# Patient Record
Sex: Female | Born: 1990 | Race: White | Hispanic: No | Marital: Married | Smoking: Never smoker
Health system: Southern US, Community
[De-identification: ages and names within clinical notes are randomized; demographics above are authoritative.]

## PROBLEM LIST (undated history)

## (undated) DIAGNOSIS — Z789 Other specified health status: Secondary | ICD-10-CM

## (undated) DIAGNOSIS — D649 Anemia, unspecified: Secondary | ICD-10-CM

## (undated) HISTORY — PX: CHOLECYSTECTOMY: SHX55

---
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
## 2013-08-29 ENCOUNTER — Emergency Department: Payer: Self-pay | Admitting: Emergency Medicine

## 2013-09-02 ENCOUNTER — Encounter (HOSPITAL_COMMUNITY): Payer: Self-pay | Admitting: Pharmacy Technician

## 2013-09-02 ENCOUNTER — Encounter (HOSPITAL_COMMUNITY): Payer: Self-pay | Admitting: *Deleted

## 2013-09-03 ENCOUNTER — Ambulatory Visit (HOSPITAL_COMMUNITY)
Admission: RE | Admit: 2013-09-03 | Payer: Managed Care, Other (non HMO) | Source: Ambulatory Visit | Admitting: Orthopedic Surgery

## 2013-09-03 HISTORY — DX: Other specified health status: Z78.9

## 2013-09-03 SURGERY — OPEN REDUCTION INTERNAL FIXATION (ORIF) HUMERAL SHAFT FRACTURE
Anesthesia: General | Laterality: Right

## 2013-09-04 ENCOUNTER — Other Ambulatory Visit (HOSPITAL_COMMUNITY): Payer: Self-pay | Admitting: Orthopedic Surgery

## 2013-09-05 ENCOUNTER — Ambulatory Visit (HOSPITAL_COMMUNITY): Payer: Managed Care, Other (non HMO)

## 2013-09-05 ENCOUNTER — Encounter (HOSPITAL_COMMUNITY): Payer: Managed Care, Other (non HMO) | Admitting: Certified Registered"

## 2013-09-05 ENCOUNTER — Observation Stay (HOSPITAL_COMMUNITY)
Admission: RE | Admit: 2013-09-05 | Discharge: 2013-09-06 | Disposition: A | Discharge status: Home / Self Care | Payer: Managed Care, Other (non HMO) | Source: Ambulatory Visit | Attending: Orthopedic Surgery | Admitting: Orthopedic Surgery

## 2013-09-05 ENCOUNTER — Encounter (HOSPITAL_COMMUNITY): Payer: Self-pay | Admitting: *Deleted

## 2013-09-05 ENCOUNTER — Ambulatory Visit (HOSPITAL_COMMUNITY): Payer: Managed Care, Other (non HMO) | Admitting: Certified Registered"

## 2013-09-05 ENCOUNTER — Encounter (HOSPITAL_COMMUNITY)
Admission: RE | Disposition: A | Discharge status: Home / Self Care | Payer: Self-pay | Source: Ambulatory Visit | Attending: Orthopedic Surgery

## 2013-09-05 ENCOUNTER — Observation Stay (HOSPITAL_COMMUNITY): Payer: Managed Care, Other (non HMO)

## 2013-09-05 DIAGNOSIS — G5630 Lesion of radial nerve, unspecified upper limb: Secondary | ICD-10-CM | POA: Insufficient documentation

## 2013-09-05 DIAGNOSIS — S42309A Unspecified fracture of shaft of humerus, unspecified arm, initial encounter for closed fracture: Principal | ICD-10-CM | POA: Insufficient documentation

## 2013-09-05 DIAGNOSIS — W19XXXA Unspecified fall, initial encounter: Secondary | ICD-10-CM | POA: Insufficient documentation

## 2013-09-05 HISTORY — PX: ORIF HUMERUS FRACTURE: SHX2126

## 2013-09-05 LAB — CBC
HEMATOCRIT: 41.8 % (ref 36.0–46.0)
HEMOGLOBIN: 14.3 g/dL (ref 12.0–15.0)
MCH: 29.9 pg (ref 26.0–34.0)
MCHC: 34.2 g/dL (ref 30.0–36.0)
MCV: 87.3 fL (ref 78.0–100.0)
Platelets: 264 10*3/uL (ref 150–400)
RBC: 4.79 MIL/uL (ref 3.87–5.11)
RDW: 12.7 % (ref 11.5–15.5)
WBC: 10.1 10*3/uL (ref 4.0–10.5)

## 2013-09-05 LAB — HCG, SERUM, QUALITATIVE: Preg, Serum: NEGATIVE

## 2013-09-05 SURGERY — OPEN REDUCTION INTERNAL FIXATION (ORIF) HUMERAL SHAFT FRACTURE
Anesthesia: Regional | Laterality: Right

## 2013-09-05 MED ORDER — LIDOCAINE HCL (CARDIAC) 20 MG/ML IV SOLN
INTRAVENOUS | Status: AC
  Filled 2013-09-05: qty 5

## 2013-09-05 MED ORDER — ACETAMINOPHEN 325 MG PO TABS: 650.0000 mg | ORAL_TABLET | Freq: Four times a day (QID) | ORAL | Status: DC | PRN

## 2013-09-05 MED ORDER — FENTANYL CITRATE 0.05 MG/ML IJ SOLN
INTRAMUSCULAR | Status: AC
  Administered 2013-09-05: 50 ug via INTRAVENOUS
  Filled 2013-09-05: qty 2

## 2013-09-05 MED ORDER — ACETAMINOPHEN 325 MG PO TABS: 325.0000 mg | ORAL_TABLET | ORAL | Status: DC | PRN

## 2013-09-05 MED ORDER — METHOCARBAMOL 500 MG PO TABS: 500.0000 mg | ORAL_TABLET | Freq: Four times a day (QID) | ORAL | Status: DC | PRN

## 2013-09-05 MED ORDER — ARTIFICIAL TEARS OP OINT
TOPICAL_OINTMENT | OPHTHALMIC | Status: DC | PRN
  Administered 2013-09-05: 1 via OPHTHALMIC

## 2013-09-05 MED ORDER — OXYCODONE HCL 5 MG/5ML PO SOLN: 5.0000 mg | Freq: Once | ORAL | Status: AC | PRN

## 2013-09-05 MED ORDER — DOCUSATE SODIUM 100 MG PO CAPS
100.0000 mg | ORAL_CAPSULE | Freq: Two times a day (BID) | ORAL | Status: DC
  Administered 2013-09-05: 100 mg via ORAL
  Filled 2013-09-05 (×3): qty 1

## 2013-09-05 MED ORDER — ACETAMINOPHEN 650 MG RE SUPP: 650.0000 mg | Freq: Four times a day (QID) | RECTAL | Status: DC | PRN

## 2013-09-05 MED ORDER — SUCCINYLCHOLINE CHLORIDE 20 MG/ML IJ SOLN
INTRAMUSCULAR | Status: AC
  Filled 2013-09-05: qty 1

## 2013-09-05 MED ORDER — MIDAZOLAM HCL 2 MG/2ML IJ SOLN
1.0000 mg | INTRAMUSCULAR | Status: DC | PRN
  Administered 2013-09-05: 2 mg via INTRAVENOUS

## 2013-09-05 MED ORDER — DEXTROSE 5 % IV SOLN: 500.0000 mg | Freq: Four times a day (QID) | INTRAVENOUS | Status: DC | PRN

## 2013-09-05 MED ORDER — ACETAMINOPHEN 160 MG/5ML PO SOLN: 325.0000 mg | ORAL | Status: DC | PRN

## 2013-09-05 MED ORDER — KETOROLAC TROMETHAMINE 30 MG/ML IJ SOLN
30.0000 mg | Freq: Four times a day (QID) | INTRAMUSCULAR | Status: DC
  Administered 2013-09-05 – 2013-09-06 (×2): 30 mg via INTRAVENOUS
  Filled 2013-09-05 (×6): qty 1

## 2013-09-05 MED ORDER — PROPOFOL 10 MG/ML IV BOLUS
INTRAVENOUS | Status: AC
  Filled 2013-09-05: qty 20

## 2013-09-05 MED ORDER — ONDANSETRON HCL 4 MG/2ML IJ SOLN: 4.0000 mg | Freq: Four times a day (QID) | INTRAMUSCULAR | Status: DC | PRN

## 2013-09-05 MED ORDER — ARTIFICIAL TEARS OP OINT
TOPICAL_OINTMENT | OPHTHALMIC | Status: AC
  Filled 2013-09-05: qty 3.5

## 2013-09-05 MED ORDER — EPHEDRINE SULFATE 50 MG/ML IJ SOLN
INTRAMUSCULAR | Status: AC
  Filled 2013-09-05: qty 1

## 2013-09-05 MED ORDER — MENTHOL 3 MG MT LOZG: 1.0000 | LOZENGE | OROMUCOSAL | Status: DC | PRN

## 2013-09-05 MED ORDER — FENTANYL CITRATE 0.05 MG/ML IJ SOLN
50.0000 ug | INTRAMUSCULAR | Status: DC | PRN
  Administered 2013-09-05: 50 ug via INTRAVENOUS

## 2013-09-05 MED ORDER — NEOSTIGMINE METHYLSULFATE 1 MG/ML IJ SOLN
INTRAMUSCULAR | Status: AC
  Filled 2013-09-05: qty 10

## 2013-09-05 MED ORDER — METOCLOPRAMIDE HCL 10 MG PO TABS: 5.0000 mg | ORAL_TABLET | Freq: Three times a day (TID) | ORAL | Status: DC | PRN

## 2013-09-05 MED ORDER — POTASSIUM CHLORIDE IN NACL 20-0.9 MEQ/L-% IV SOLN
INTRAVENOUS | Status: DC
  Administered 2013-09-05 – 2013-09-06 (×2): via INTRAVENOUS
  Filled 2013-09-05 (×2): qty 1000

## 2013-09-05 MED ORDER — CEFAZOLIN SODIUM-DEXTROSE 2-3 GM-% IV SOLR
INTRAVENOUS | Status: DC | PRN
  Administered 2013-09-05: 2 g via INTRAVENOUS

## 2013-09-05 MED ORDER — ONDANSETRON HCL 4 MG/2ML IJ SOLN
INTRAMUSCULAR | Status: DC | PRN
  Administered 2013-09-05: 4 mg via INTRAVENOUS

## 2013-09-05 MED ORDER — ROPIVACAINE HCL 5 MG/ML IJ SOLN
INTRAMUSCULAR | Status: DC | PRN
  Administered 2013-09-05: 20 mL via PERINEURAL

## 2013-09-05 MED ORDER — FENTANYL CITRATE 0.05 MG/ML IJ SOLN
25.0000 ug | INTRAMUSCULAR | Status: DC | PRN
  Administered 2013-09-05: 25 ug via INTRAVENOUS
  Administered 2013-09-05: 50 ug via INTRAVENOUS

## 2013-09-05 MED ORDER — SODIUM CHLORIDE 0.9 % IJ SOLN
INTRAMUSCULAR | Status: AC
  Filled 2013-09-05: qty 10

## 2013-09-05 MED ORDER — NEOSTIGMINE METHYLSULFATE 1 MG/ML IJ SOLN
INTRAMUSCULAR | Status: DC | PRN
  Administered 2013-09-05: 2 mg via INTRAVENOUS

## 2013-09-05 MED ORDER — ROCURONIUM BROMIDE 50 MG/5ML IV SOLN
INTRAVENOUS | Status: AC
  Filled 2013-09-05: qty 1

## 2013-09-05 MED ORDER — OXYCODONE HCL 5 MG PO TABS
5.0000 mg | ORAL_TABLET | ORAL | Status: DC | PRN
  Administered 2013-09-05 – 2013-09-06 (×4): 10 mg via ORAL
  Filled 2013-09-05 (×4): qty 2

## 2013-09-05 MED ORDER — PHENOL 1.4 % MT LIQD: 1.0000 | OROMUCOSAL | Status: DC | PRN

## 2013-09-05 MED ORDER — CEFAZOLIN SODIUM-DEXTROSE 2-3 GM-% IV SOLR
2.0000 g | Freq: Three times a day (TID) | INTRAVENOUS | Status: AC
  Administered 2013-09-05 – 2013-09-06 (×2): 2 g via INTRAVENOUS
  Filled 2013-09-05 (×2): qty 50

## 2013-09-05 MED ORDER — LACTATED RINGERS IV SOLN
INTRAVENOUS | Status: DC
  Administered 2013-09-05 (×2): via INTRAVENOUS

## 2013-09-05 MED ORDER — GLYCOPYRROLATE 0.2 MG/ML IJ SOLN
INTRAMUSCULAR | Status: DC | PRN
  Administered 2013-09-05: 0.3 mg via INTRAVENOUS

## 2013-09-05 MED ORDER — FENTANYL CITRATE 0.05 MG/ML IJ SOLN
INTRAMUSCULAR | Status: AC
  Filled 2013-09-05: qty 2

## 2013-09-05 MED ORDER — ROCURONIUM BROMIDE 100 MG/10ML IV SOLN
INTRAVENOUS | Status: DC | PRN
  Administered 2013-09-05: 10 mg via INTRAVENOUS
  Administered 2013-09-05: 50 mg via INTRAVENOUS

## 2013-09-05 MED ORDER — MIDAZOLAM HCL 2 MG/2ML IJ SOLN
INTRAMUSCULAR | Status: AC
  Administered 2013-09-05: 2 mg via INTRAVENOUS
  Filled 2013-09-05: qty 2

## 2013-09-05 MED ORDER — OXYCODONE HCL 5 MG PO TABS
ORAL_TABLET | ORAL | Status: AC
  Filled 2013-09-05: qty 1

## 2013-09-05 MED ORDER — MORPHINE SULFATE 2 MG/ML IJ SOLN
1.0000 mg | INTRAMUSCULAR | Status: DC | PRN
  Administered 2013-09-05 – 2013-09-06 (×2): 1 mg via INTRAVENOUS
  Filled 2013-09-05 (×2): qty 1

## 2013-09-05 MED ORDER — CEFAZOLIN SODIUM-DEXTROSE 2-3 GM-% IV SOLR
INTRAVENOUS | Status: AC
  Filled 2013-09-05: qty 50

## 2013-09-05 MED ORDER — GLYCOPYRROLATE 0.2 MG/ML IJ SOLN
INTRAMUSCULAR | Status: AC
  Filled 2013-09-05: qty 3

## 2013-09-05 MED ORDER — SODIUM CHLORIDE 0.9 % IR SOLN
Status: DC | PRN
  Administered 2013-09-05: 1000 mL

## 2013-09-05 MED ORDER — ONDANSETRON HCL 4 MG PO TABS: 4.0000 mg | ORAL_TABLET | Freq: Four times a day (QID) | ORAL | Status: DC | PRN

## 2013-09-05 MED ORDER — PROPOFOL 10 MG/ML IV BOLUS
INTRAVENOUS | Status: DC | PRN
  Administered 2013-09-05: 50 mg via INTRAVENOUS
  Administered 2013-09-05: 120 mg via INTRAVENOUS

## 2013-09-05 MED ORDER — ONDANSETRON HCL 4 MG/2ML IJ SOLN: 4.0000 mg | Freq: Once | INTRAMUSCULAR | Status: DC | PRN

## 2013-09-05 MED ORDER — ONDANSETRON HCL 4 MG/2ML IJ SOLN
INTRAMUSCULAR | Status: AC
  Filled 2013-09-05: qty 2

## 2013-09-05 MED ORDER — LIDOCAINE HCL (CARDIAC) 20 MG/ML IV SOLN
INTRAVENOUS | Status: DC | PRN
  Administered 2013-09-05: 100 mg via INTRAVENOUS

## 2013-09-05 MED ORDER — METOCLOPRAMIDE HCL 5 MG/ML IJ SOLN: 5.0000 mg | Freq: Three times a day (TID) | INTRAMUSCULAR | Status: DC | PRN

## 2013-09-05 MED ORDER — OXYCODONE HCL 5 MG PO TABS
5.0000 mg | ORAL_TABLET | Freq: Once | ORAL | Status: AC | PRN
  Administered 2013-09-05: 5 mg via ORAL

## 2013-09-05 SURGICAL SUPPLY — 51 items
BANDAGE ELASTIC 4 VELCRO ST LF (GAUZE/BANDAGES/DRESSINGS) ×3 IMPLANT
BANDAGE ELASTIC 6 VELCRO ST LF (GAUZE/BANDAGES/DRESSINGS) ×3 IMPLANT
BENZOIN TINCTURE PRP APPL 2/3 (GAUZE/BANDAGES/DRESSINGS) ×3 IMPLANT
BIT DRILL 3.2 QC DISP (BIT) ×3 IMPLANT
BNDG COHESIVE 4X5 TAN STRL (GAUZE/BANDAGES/DRESSINGS) ×3 IMPLANT
CLOSURE WOUND 1/2 X4 (GAUZE/BANDAGES/DRESSINGS) ×1
COVER SURGICAL LIGHT HANDLE (MISCELLANEOUS) ×3 IMPLANT
DRAIN PENROSE 1/2X12 LTX STRL (WOUND CARE) IMPLANT
DRAPE C-ARM 42X72 X-RAY (DRAPES) IMPLANT
DRAPE INCISE IOBAN 85X60 (DRAPES) ×3 IMPLANT
DRAPE U-SHAPE 47X51 STRL (DRAPES) ×3 IMPLANT
DRSG MEPILEX BORDER 4X12 (GAUZE/BANDAGES/DRESSINGS) ×3 IMPLANT
DRSG PAD ABDOMINAL 8X10 ST (GAUZE/BANDAGES/DRESSINGS) IMPLANT
DURAPREP 26ML APPLICATOR (WOUND CARE) ×3 IMPLANT
ELECT REM PT RETURN 9FT ADLT (ELECTROSURGICAL) ×3
ELECTRODE REM PT RTRN 9FT ADLT (ELECTROSURGICAL) ×1 IMPLANT
FACESHIELD LNG OPTICON STERILE (SAFETY) ×3 IMPLANT
GAUZE XEROFORM 5X9 LF (GAUZE/BANDAGES/DRESSINGS) IMPLANT
GLOVE BIOGEL PI IND STRL 8 (GLOVE) ×1 IMPLANT
GLOVE BIOGEL PI INDICATOR 8 (GLOVE) ×2
GLOVE SURG ORTHO 8.0 STRL STRW (GLOVE) ×3 IMPLANT
GOWN PREVENTION PLUS LG XLONG (DISPOSABLE) IMPLANT
GOWN PREVENTION PLUS XLARGE (GOWN DISPOSABLE) ×3 IMPLANT
GOWN STRL NON-REIN LRG LVL3 (GOWN DISPOSABLE) ×6 IMPLANT
KIT BASIN OR (CUSTOM PROCEDURE TRAY) ×3 IMPLANT
KIT ROOM TURNOVER OR (KITS) ×3 IMPLANT
MANIFOLD NEPTUNE II (INSTRUMENTS) IMPLANT
NEEDLE 21X1 OR PACK (NEEDLE) IMPLANT
NS IRRIG 1000ML POUR BTL (IV SOLUTION) ×3 IMPLANT
PACK SHOULDER (CUSTOM PROCEDURE TRAY) ×3 IMPLANT
PAD ARMBOARD 7.5X6 YLW CONV (MISCELLANEOUS) ×6 IMPLANT
PAD CAST 4YDX4 CTTN HI CHSV (CAST SUPPLIES) IMPLANT
PADDING CAST COTTON 4X4 STRL (CAST SUPPLIES)
PENCIL BUTTON HOLSTER BLD 10FT (ELECTRODE) IMPLANT
PLATE LOCK COMP 8H 4.5 (Plate) ×3 IMPLANT
SCREW NLOCK CORT 4.5X20 (Screw) ×6 IMPLANT
SCREW NLOCK CORT 4.5X22 (Screw) ×3 IMPLANT
SCREW NLOCK CORT 4.5X24 (Screw) ×15 IMPLANT
SPONGE GAUZE 4X4 12PLY (GAUZE/BANDAGES/DRESSINGS) IMPLANT
SPONGE LAP 4X18 X RAY DECT (DISPOSABLE) ×6 IMPLANT
STAPLER VISISTAT 35W (STAPLE) IMPLANT
STOCKINETTE IMPERVIOUS 9X36 MD (GAUZE/BANDAGES/DRESSINGS) IMPLANT
STRIP CLOSURE SKIN 1/2X4 (GAUZE/BANDAGES/DRESSINGS) ×2 IMPLANT
SUCTION FRAZIER TIP 10 FR DISP (SUCTIONS) IMPLANT
SUT VIC AB 2-0 CTB1 (SUTURE) IMPLANT
TOWEL OR 17X24 6PK STRL BLUE (TOWEL DISPOSABLE) ×3 IMPLANT
TOWEL OR 17X26 10 PK STRL BLUE (TOWEL DISPOSABLE) ×3 IMPLANT
TUBE CONNECTING 12'X1/4 (SUCTIONS)
TUBE CONNECTING 12X1/4 (SUCTIONS) IMPLANT
WATER STERILE IRR 1000ML POUR (IV SOLUTION) IMPLANT
YANKAUER SUCT BULB TIP NO VENT (SUCTIONS) IMPLANT

## 2013-09-05 NOTE — Anesthesia Procedure Notes (Addendum)
Anesthesia Regional Block:  Supraclavicular block  Pre-Anesthetic Checklist: ,, timeout performed, Correct Patient, Correct Site, Correct Laterality, Correct Procedure, Correct Position, site marked, Risks and benefits discussed,  Surgical consent,  Pre-op evaluation,  At surgeon's request and post-op pain management  Laterality: Upper and Right  Prep: chloraprep       Needles:  Injection technique: Single-shot  Needle Type: Echogenic Stimulator Needle          Additional Needles:  Procedures: ultrasound guided (picture in chart) Supraclavicular block Narrative:  Start time: 09/05/2013 3:29 PM End time: 09/05/2013 3:34 PM Injection made incrementally with aspirations every 5 mL.  Performed by: Personally  Anesthesiologist: Maple HudsonMoser  Additional Notes: H+P and labs reviewed, risks and benefits discussed with patient, procedure tolerated well without complications   Procedure Name: Intubation Date/Time: 09/05/2013 5:00 PM Performed by: Jefm MilesENNIE, Shaquil Aldana E Pre-anesthesia Checklist: Patient identified, Emergency Drugs available, Suction available, Patient being monitored and Timeout performed Patient Re-evaluated:Patient Re-evaluated prior to inductionOxygen Delivery Method: Circle system utilized Preoxygenation: Pre-oxygenation with 100% oxygen Intubation Type: IV induction Ventilation: Mask ventilation without difficulty Laryngoscope Size: Mac and 3 Grade View: Grade I Tube type: Oral Tube size: 7.0 mm Number of attempts: 1 Airway Equipment and Method: Stylet Placement Confirmation: ETT inserted through vocal cords under direct vision,  positive ETCO2 and breath sounds checked- equal and bilateral Secured at: 21 cm Tube secured with: Tape Dental Injury: Teeth and Oropharynx as per pre-operative assessment

## 2013-09-05 NOTE — Transfer of Care (Signed)
Immediate Anesthesia Transfer of Care Note  Patient: Nancy Huffman  Procedure(s) Performed: Procedure(s) with comments: OPEN REDUCTION INTERNAL FIXATION (ORIF) HUMERAL SHAFT FRACTURE (Right) - Right Humeral Shaft Fracture Fixation  Patient Location: PACU  Anesthesia Type:GA combined with regional for post-op pain  Level of Consciousness: awake, alert  and oriented  Airway & Oxygen Therapy: Patient Spontanous Breathing and Patient connected to nasal cannula oxygen  Post-op Assessment: Report given to PACU RN and Post -op Vital signs reviewed and stable  Post vital signs: Reviewed and stable  Complications: No apparent anesthesia complications

## 2013-09-05 NOTE — H&P (Signed)
Nancy FarberBrittany Huffman is an 23 y.o. female.   Chief Complaint: Right arm pain HPI: [place] is a 23 year old female right-hand-dominant who fell on her right arm 08/28/2013. She sustained a closed fracture of the humeral shaft transverse. She did develop a radial nerve palsy from this fracture. She initially saw Dr. handy but because of administrative issues she is referred to me for further management. She reports continued instability in the arm. He recommended fixation of the fracture. She's evaluated in clinic yesterday and I do agree with that assessment. There is no family history of DVT or pulmonary embolism.  Past Medical History  Diagnosis Date  . Medical history non-contributory     Past Surgical History  Procedure Laterality Date  . Cholecystectomy      History reviewed. No pertinent family history. Social History:  reports that she has never smoked. She does not have any smokeless tobacco history on file. She reports that she drinks alcohol. She reports that she does not use illicit drugs.  Allergies: No Known Allergies  Medications Prior to Admission  Medication Sig Dispense Refill  . oxyCODONE-acetaminophen (PERCOCET/ROXICET) 5-325 MG per tablet Take 1 tablet by mouth every 4 (four) hours as needed for severe pain.        Results for orders placed during the hospital encounter of 09/05/13 (from the past 48 hour(s))  HCG, SERUM, QUALITATIVE     Status: None   Collection Time    09/05/13  2:04 PM      Result Value Ref Range   Preg, Serum NEGATIVE  NEGATIVE   Comment: SHORT SAMPLE                THE SENSITIVITY OF THIS     METHODOLOGY IS >10 mIU/mL.  CBC     Status: None   Collection Time    09/05/13  2:04 PM      Result Value Ref Range   WBC 10.1  4.0 - 10.5 K/uL   RBC 4.79  3.87 - 5.11 MIL/uL   Hemoglobin 14.3  12.0 - 15.0 g/dL   HCT 40.941.8  81.136.0 - 91.446.0 %   MCV 87.3  78.0 - 100.0 fL   MCH 29.9  26.0 - 34.0 pg   MCHC 34.2  30.0 - 36.0 g/dL   RDW 78.212.7  95.611.5 - 21.315.5 %   Platelets 264  150 - 400 K/uL   No results found.  Review of Systems  Constitutional: Negative.   HENT: Negative.   Eyes: Negative.   Respiratory: Negative.   Cardiovascular: Negative.   Gastrointestinal: Negative.   Genitourinary: Negative.   Musculoskeletal: Negative.   Skin: Negative.   Neurological: Negative.   Endo/Heme/Allergies: Negative.   Psychiatric/Behavioral: Negative.     Blood pressure 119/53, pulse 84, temperature 98.3 F (36.8 C), temperature source Oral, resp. rate 14, height 5\' 2"  (1.575 m), weight 79.578 kg (175 lb 7 oz), last menstrual period 08/15/2013, SpO2 100.00%. Physical Exam  Constitutional: She appears well-developed.  HENT:  Head: Normocephalic.  Eyes: Pupils are equal, round, and reactive to light.  Neck: Normal range of motion.  Cardiovascular: Normal rate.   Respiratory: Effort normal.  Neurological: She is alert.  Skin: Skin is warm.  Psychiatric: She has a normal mood and affect.   examination the right arm demonstrates unstable fracture rate or pulse intact radial nerve nonfunctional paresthesias present on the dorsal aspect of the hand elbow range of motion intact wrist range of motion passively intact S. PL function intact FDP  small and ring finger intact  Assessment/Plan Radiographs demonstrate transverse mildly comminuted humeral shaft fracture impression humeral shaft fracture with radial nerve palsy plan operative fixation 3 anterior approach of the fracture. Plan is to use anterolateral approach with protection of the radial nerve. The low energy closed fracture at likelihood that the radial nerve function will recover. Plan use therapy they splinting of the hand until regular function recovers risk and benefits of surgery discussed with the patient and family including going to infection nerve vessel damage nonunion malunion need for more surgery including potential nerve surgery if the nerve is not recover within 6 months. All questions  answered  Alec Mcphee SCOTT 09/05/2013, 4:41 PM

## 2013-09-05 NOTE — Preoperative (Signed)
Beta Blockers   Reason not to administer Beta Blockers:Not Applicable 

## 2013-09-05 NOTE — Brief Op Note (Signed)
09/05/2013  7:45 PM  PATIENT:  G[place]adaBrittany Higginbotham  23 y.o. female  PRE-OPERATIVE DIAGNOSIS:  Right Humeral Shaft Fracture  POST-OPERATIVE DIAGNOSIS:  Right Humeral Shaft Fracture  PROCEDURE:  Procedure(s): OPEN REDUCTION INTERNAL FIXATION (ORIF) HUMERAL SHAFT FRACTURE  SURGEON:  Surgeon(s): Cammy CopaGregory Scott Donnie Panik, MD Coralyn MarkNaiping Glee ArvinMichael Xu, MD  ASSISTANT: xu md  ANESTHESIA:   general  EBL: 50 ml    Total I/O In: 400 [I.V.:400] Out: -   BLOOD ADMINISTERED: none  DRAINS: none   LOCAL MEDICATIONS USED:  none  SPECIMEN:  No Specimen  COUNTS:  YES  TOURNIQUET:  * No tourniquets in log *  DICTATION: .Other Dictation: Dictation Number [id number]  PLAN OF CARE: Admit for overnight observation  PATIENT DISPOSITION:  PACU - hemodynamically stable

## 2013-09-05 NOTE — Anesthesia Preprocedure Evaluation (Addendum)
Anesthesia Evaluation  Patient identified by MRN, date of birth, ID band Patient awake    Reviewed: Allergy & Precautions, H&P , NPO status , Patient's Chart, lab work & pertinent test results  History of Anesthesia Complications Negative for: history of anesthetic complications  Airway Mallampati: I TM Distance: >3 FB Neck ROM: Full    Dental  (+) Teeth Intact, Dental Advisory Given   Pulmonary neg pulmonary ROS,    Pulmonary exam normal       Cardiovascular negative cardio ROS  Rhythm:Regular Rate:Normal     Neuro/Psych negative neurological ROS  negative psych ROS   GI/Hepatic negative GI ROS, Neg liver ROS,   Endo/Other  negative endocrine ROS  Renal/GU negative Renal ROS     Musculoskeletal   Abdominal   Peds  Hematology negative hematology ROS (+)   Anesthesia Other Findings   Reproductive/Obstetrics                          Anesthesia Physical Anesthesia Plan  ASA: I  Anesthesia Plan: General and Regional   Post-op Pain Management:    Induction: Intravenous  Airway Management Planned: LMA  Additional Equipment: None  Intra-op Plan:   Post-operative Plan: Extubation in OR  Informed Consent: I have reviewed the patients History and Physical, chart, labs and discussed the procedure including the risks, benefits and alternatives for the proposed anesthesia with the patient or authorized representative who has indicated his/her understanding and acceptance.   Dental advisory given  Plan Discussed with: CRNA and Surgeon  Anesthesia Plan Comments:         Anesthesia Quick Evaluation

## 2013-09-06 ENCOUNTER — Encounter (HOSPITAL_COMMUNITY): Payer: Self-pay | Admitting: Orthopedic Surgery

## 2013-09-06 MED ORDER — INFLUENZA VAC SPLIT QUAD 0.5 ML IM SUSP: 0.5000 mL | INTRAMUSCULAR | Status: DC

## 2013-09-06 MED ORDER — OXYCODONE HCL 5 MG PO TABS: 5.0000 mg | ORAL_TABLET | ORAL | Status: DC | PRN

## 2013-09-06 MED ORDER — METHOCARBAMOL 500 MG PO TABS: 500.0000 mg | ORAL_TABLET | Freq: Four times a day (QID) | ORAL | Status: DC | PRN

## 2013-09-06 NOTE — Progress Notes (Signed)
Subjective: Pt stable - pain controlled   Objective: Vital signs in last 24 hours: Temp:  [97.5 F (36.4 C)-98.8 F (37.1 C)] 98.8 F (37.1 C) (03/06 16100632) Pulse Rate:  [60-87] 65 (03/06 0632) Resp:  [11-23] 18 (03/06 0632) BP: (105-141)/(49-84) 105/49 mmHg (03/06 0632) SpO2:  [98 %-100 %] 100 % (03/06 96040632) Weight:  [79.578 kg (175 lb 7 oz)] 79.578 kg (175 lb 7 oz) (03/05 1405)  Intake/Output from previous day: 03/05 0701 - 03/06 0700 In: 2122.2 [P.O.:240; I.V.:1782.2; IV Piggyback:100] Out: -  Intake/Output this shift:    Exam:  Intact pulses distally  Labs:  Recent Labs  09/05/13 1404  HGB 14.3    Recent Labs  09/05/13 1404  WBC 10.1  RBC 4.79  HCT 41.8  PLT 264   No results found for this basename: NA, K, CL, CO2, BUN, CREATININE, GLUCOSE, CALCIUM,  in the last 72 hours No results found for this basename: LABPT, INR,  in the last 72 hours  Assessment/Plan: Plan dc today after OT for shoulder and elbow range of motion   Franke Menter SCOTT 09/06/2013, 8:14 AM

## 2013-09-06 NOTE — Anesthesia Postprocedure Evaluation (Signed)
Anesthesia Post Note  Patient: Nancy Huffman  Procedure(s) Performed: Procedure(s) (LRB): OPEN REDUCTION INTERNAL FIXATION (ORIF) HUMERAL SHAFT FRACTURE (Right)  Anesthesia type: General  Patient location: PACU  Post pain: Pain level controlled and Adequate analgesia  Post assessment: Post-op Vital signs reviewed, Patient's Cardiovascular Status Stable, Respiratory Function Stable, Patent Airway and Pain level controlled  Last Vitals:  Filed Vitals:   09/06/13 0632  BP: 105/49  Pulse: 65  Temp: 37.1 C  Resp: 18    Post vital signs: Reviewed and stable  Level of consciousness: awake, alert  and oriented  Complications: No apparent anesthesia complications

## 2013-09-06 NOTE — Evaluation (Signed)
Occupational Therapy Evaluation Patient Details Name: Nancy Huffman MRN: [id number] DOB: 05/13/1991 Today's Date: 09/06/2013 Time: 4098-1191 OT Time Calculation (min): 12 min  OT Assessment / Plan / Recommendation History of present illness s/p ORIF of right humeral shaft fx   Clinical Impression   Pt s/p ORIF of right humeral shaft fx.  ADL and sling education completed. OT provided pt with handout.  Pt states she feels ready to go home. Anticipate d/c home later today.   No further acute OT needs. Recommend progress rehab of shoulder as recommended by MD at f/u appt.    OT Assessment  Progress rehab of shoulder as ordered by MD at follow-up appointment    Follow Up Recommendations   (progress rehab of shoulder as recommended by MD)    Barriers to Discharge      Equipment Recommendations  None recommended by OT    Recommendations for Other Services    Frequency       Precautions / Restrictions Precautions Precautions: Shoulder Restrictions Weight Bearing Restrictions: Yes RUE Weight Bearing: Non weight bearing   Pertinent Vitals/Pain See vitals    ADL  ADL Comments: Provided pt with shoulder education handout. Pt states her mother has been assisting with UB dressing since sustaining RUE fx. Educated pt on ADL techniques and sling education.  Pt's RUE support on pillows in bed on OT arrival. Recommended use of RUE sling any time she is up unless otherwise instructed by MD.  Pt is somewhat familiar with ADL adaptations since she broke her arm last week.  OT order for sling and ADL education.  Pt verbalized that doctor had educated pt on performing circular pendulums.  Instructed pt to continue as doctor had instructed.  Also educated pt on hand, wrist and elbow AROM in order to maintain ROM. Recommended use of ice pack to assist with pain management and to decrease swelling. Also recommneded rest/sleep in recliner chair or sofa for most comfortable positioning of RUE.  Pt states  she feels very comfortable with information and feels ready to go home.     OT Diagnosis:    OT Problem List:   OT Treatment Interventions:     OT Goals(Current goals can be found in the care plan section) Acute Rehab OT Goals Patient Stated Goal: to be independent  Visit Information  Last OT Received On: 09/06/13 Assistance Needed: +1 History of Present Illness: s/p ORIF of right humeral shaft fx       Prior Functioning     Home Living Family/patient expects to be discharged to:: Private residence Living Arrangements: Parent Available Help at Discharge: Family;Available 24 hours/day Prior Function Level of Independence: Independent         Vision/Perception     Cognition  Cognition Arousal/Alertness: Awake/alert Behavior During Therapy: WFL for tasks assessed/performed Overall Cognitive Status: Within Functional Limits for tasks assessed    Extremity/Trunk Assessment Upper Extremity Assessment Upper Extremity Assessment: RUE deficits/detail RUE Deficits / Details: hand AROM WFL.  Wrist and elbow AROM slightly limited by dressing.     Mobility Bed Mobility Overal bed mobility: Modified Independent     Exercise     Balance     End of Session OT - End of Session Equipment Utilized During Treatment:  (RUE sling) Activity Tolerance: Patient tolerated treatment well Patient left: in bed;with call bell/phone within reach;with family/visitor present;with nursing/sitter in room Nurse Communication: Mobility status  GO Functional Assessment Tool Used: clinical judgement Functional Limitation: Self care Self Care Current Status (  [id number]): At least 1 percent but less than 20 percent impaired, limited or restricted Self Care Goal Status (A2130(G8988): At least 1 percent but less than 20 percent impaired, limited or restricted Self Care Discharge Status [phone](G8989): At least 1 percent but less than 20 percent impaired, limited or restricted  09/06/2013 Cipriano MileJohnson, Nancy Huffman  OTR/L Pager [phone] Office [phone]  Cipriano MileJohnson, Nancy Huffman 09/06/2013, 9:39 AM

## 2013-09-06 NOTE — Progress Notes (Signed)
Pt discharged home. D/c instructions given. No questions verbalized. Vitals stable. 

## 2013-09-06 NOTE — Op Note (Signed)
NAMTiana Huffman:  [name], [name]             ACCOUNT NO.:  [id number]  MEDICAL RECORD NO.:  [id number]  LOCATION:  5N10C                        FACILITY:  [facility]  PHYSICIAN:  Burnard BuntingG. Scott Aarohi Redditt, M.D.    DATE OF BIRTH:  01-23-1991  DATE OF PROCEDURE:  09/05/2013 DATE OF DISCHARGE:                              OPERATIVE REPORT   PREOPERATIVE DIAGNOSIS:  Right humeral shaft fracture.  POSTOPERATIVE DIAGNOSIS:  Right humeral shaft fracture.  PROCEDURE:  Right humeral shaft fracture open reduction and internal fixation.  SURGEONS:  Burnard BuntingG. Scott Franko Hilliker, MD.  ASSISTANT:  Nelda SevereMichael Tooke, MD.  INDICATIONS:  The patient is a patient with right humeral shaft fracture, radial nerve palsy presents for operative management after explanation of risks and benefits.  PROCEDURE IN DETAIL:  The patient was brought to the operating room where general endotracheal anesthesia was induced, time-out was called. The patient's arm was prescrubbed with alcohol and Betadine, which were allowed to air dry, prepped with DuraPrep solution and draped in sterile manner.  Collier Flowersoban was used to cover the operative field.  Anterolateral approach to the humerus was made.  Skin and subcutaneous tissues were sharply divided.  Biceps mobilized medially.  Brachialis split, more on the lateral side.  Periosteal elevation performed.  Radial nerve palpated, but not visualized.  It was palpably intact.  At this time, the fracture was reduced.  An 8 hole plate was then placed.  6 cortices achieved above fracture, 8 cortices below fracture, 1 screw which was then a butterfly fragment was deemed to potentially cause plus micro motion at the fracture site.  It was removed.  It was essentially within the fracture line.  Under fluoroscopic guidance, correct screw length was confirmed.  Arm was stable.  At this time, a thorough irrigation was performed.  Incision was closed using 0 Vicryl suture, 2-0 Vicryl suture, and 3-0 Prolene.  Bulky dressing and  sling were applied.  The patient tolerated the procedure well without immediate complication and transferred to the recovery room in stable condition.     Burnard BuntingG. Scott Anola Mcgough, M.D.     GSD/MEDQ  D:  09/05/2013  T:  09/06/2013  Job:  [id number]

## 2013-09-09 NOTE — Discharge Summary (Signed)
Physician Discharge Summary  Patient ID: Nancy Huffman MRN: [id number] DOB/AGE: [birth year] 23 y.o.  Admit date: 09/05/2013 Discharge date: 09/09/2013  Admission Diagnoses:  Active Problems:   Humeral shaft fracture   Discharge Diagnoses:  Same  Surgeries: Procedure(s): OPEN REDUCTION INTERNAL FIXATION (ORIF) HUMERAL SHAFT FRACTURE on 09/05/2013   Consultants:    Discharged Condition: Stable  Hospital Course: Seraphine Gudiel is an 23 y.o. female who was admitted 09/05/2013 with a chief complaint of arm pain, and found to have a diagnosis of humeral shaft fracture.  They were brought to the operating room on 09/05/2013 and underwent the above named procedures.  Did well and dced home pod 1, radial nerve palsy present preop to be observed  Antibiotics given:  Anti-infectives   Start     Dose/Rate Route Frequency Ordered Stop   09/05/13 2200  ceFAZolin (ANCEF) IVPB 2 g/50 mL premix     2 g 100 mL/hr over 30 Minutes Intravenous 3 times per day 09/05/13 2100 09/06/13 0549    .  Recent vital signs:  Filed Vitals:   09/06/13 0632  BP: 105/49  Pulse: 65  Temp: 98.8 F (37.1 C)  Resp: 18    Recent laboratory studies:  Results for orders placed during the hospital encounter of 09/05/13  HCG, SERUM, QUALITATIVE      Result Value Ref Range   Preg, Serum NEGATIVE  NEGATIVE  CBC      Result Value Ref Range   WBC 10.1  4.0 - 10.5 K/uL   RBC 4.79  3.87 - 5.11 MIL/uL   Hemoglobin 14.3  12.0 - 15.0 g/dL   HCT 40.9  81.1 - 91.4 %   MCV 87.3  78.0 - 100.0 fL   MCH 29.9  26.0 - 34.0 pg   MCHC 34.2  30.0 - 36.0 g/dL   RDW 78.2  95.6 - 21.3 %   Platelets 264  150 - 400 K/uL    Discharge Medications:     Medication List    STOP taking these medications       oxyCODONE-acetaminophen 5-325 MG per tablet  Commonly known as:  PERCOCET/ROXICET      TAKE these medications       methocarbamol 500 MG tablet  Commonly known as:  ROBAXIN  Take 1 tablet (500 mg total) by mouth every  6 (six) hours as needed for muscle spasms.     oxyCODONE 5 MG immediate release tablet  Commonly known as:  Oxy IR/ROXICODONE  Take 1-2 tablets (5-10 mg total) by mouth every 3 (three) hours as needed for breakthrough pain.        Diagnostic Studies: Dg Shoulder Right  09/05/2013   CLINICAL DATA:  Postop  EXAM: RIGHT SHOULDER - 2+ VIEW  COMPARISON:  08/29/2013.  FINDINGS: Transverse fracture of the mid aspect of the right humeral shaft has been reduced with plate and screws. Only the proximal aspect of the plate and screws is visualized. The fracture fragments appear much better aligned on this single limited projection.  IMPRESSION: Transverse fracture of the mid aspect of the right humeral shaft has been reduced with plate and screws. Only the proximal aspect of the plate and screws is visualized. The fracture fragments appear much better aligned on this single limited projection.   Electronically Signed   By: Bridgett Larsson M.D.   On: 09/05/2013 20:45   Dg Humerus Right  09/05/2013   CLINICAL DATA:  Humeral shaft fracture.  EXAM: DG C-ARM 1-60 MIN; RIGHT HUMERUS -  2+ VIEW  COMPARISON:  None.  FINDINGS: Plate and screw fixation from an anterior approach across the humerus fracture. 2 intraoperative fluoroscopic spot views are submitted for interpretation.  IMPRESSION: Right humerus ORIF with plate and screw fixation.   Electronically Signed   By: Andreas NewportGeoffrey  Lamke M.D.   On: 09/05/2013 19:37   Dg C-arm 1-60 Min  09/05/2013   CLINICAL DATA:  Humeral shaft fracture.  EXAM: DG C-ARM 1-60 MIN; RIGHT HUMERUS - 2+ VIEW  COMPARISON:  None.  FINDINGS: Plate and screw fixation from an anterior approach across the humerus fracture. 2 intraoperative fluoroscopic spot views are submitted for interpretation.  IMPRESSION: Right humerus ORIF with plate and screw fixation.   Electronically Signed   By: Andreas NewportGeoffrey  Lamke M.D.   On: 09/05/2013 19:37    Disposition: 01-Home or Self Care      Discharge Orders   Future  Orders Complete By Expires   Call MD / Call 911  As directed    Comments:     If you experience chest pain or shortness of breath, CALL 911 and be transported to the hospital emergency room.  If you develope a fever above 101 F, pus (white drainage) or increased drainage or redness at the wound, or calf pain, call your surgeon's office.   Constipation Prevention  As directed    Comments:     Drink plenty of fluids.  Prune juice may be helpful.  You may use a stool softener, such as Colace (over the counter) 100 mg twice a day.  Use MiraLax (over the counter) for constipation as needed.   Diet - low sodium heart healthy  As directed    Discharge instructions  As directed    Comments:     OK for elbow and shoulder range of motion No lifting with right arm Keep incision dry Change dressing Sunday   Increase activity slowly as tolerated  As directed          Signed: DEAN,GREGORY SCOTT 09/09/2013, 10:48 AM

## 2015-08-20 ENCOUNTER — Ambulatory Visit (INDEPENDENT_AMBULATORY_CARE_PROVIDER_SITE_OTHER): Payer: Managed Care, Other (non HMO) | Admitting: Family Medicine

## 2015-08-20 ENCOUNTER — Encounter: Payer: Self-pay | Admitting: Family Medicine

## 2015-08-20 VITALS — BP 112/70 | HR 67 | Temp 98.8°F | Resp 16 | Ht 61.0 in | Wt 211.4 lb

## 2015-08-20 DIAGNOSIS — J302 Other seasonal allergic rhinitis: Secondary | ICD-10-CM | POA: Diagnosis not present

## 2015-08-20 DIAGNOSIS — E7800 Pure hypercholesterolemia, unspecified: Secondary | ICD-10-CM | POA: Insufficient documentation

## 2015-08-20 DIAGNOSIS — J0100 Acute maxillary sinusitis, unspecified: Secondary | ICD-10-CM

## 2015-08-20 DIAGNOSIS — R12 Heartburn: Secondary | ICD-10-CM | POA: Insufficient documentation

## 2015-08-20 MED ORDER — AMOXICILLIN 875 MG PO TABS: 875.0000 mg | ORAL_TABLET | Freq: Two times a day (BID) | ORAL | Status: DC

## 2015-08-20 MED ORDER — HYDROCODONE-HOMATROPINE 5-1.5 MG/5ML PO SYRP: 5.0000 mL | ORAL_SOLUTION | Freq: Three times a day (TID) | ORAL | Status: DC | PRN

## 2015-08-20 NOTE — Progress Notes (Signed)
Patient ID: Nancy Huffman, female   DOB: 12-11-1990, 25 y.o.   MRN: [id number]   Patient: Nancy Huffman Female    DOB: 08/25/1990   24 y.o.   MRN: [id number] Visit Date: 08/20/2015  Today's Provider: Dortha Kern, PA   Chief Complaint  Patient presents with  . Establish Care  . URI   Subjective:     Patient presents to Establish Care. Patient last seen by pediatrician Dr. Suzie Portela. Patient states she has not been seen in several years. Patient reported last pap smear was 2008 and last Tetanus was 2008.  URI  This is a new problem. The current episode started more than 1 month ago (Started like "allergies" with sneezing, cough and rhinorrhea the end of December 2016.). The problem has been waxing and waning. Associated symptoms include congestion, coughing, diarrhea, headaches, rhinorrhea, a sore throat, vomiting (when coughing a lot) and wheezing. Treatments tried: Has tried cough syrup and throat lozenges with mild relief.   Patient Active Problem List   Diagnosis Date Noted  . Seasonal allergies 08/20/2015  . Heartburn 08/20/2015  . High cholesterol 08/20/2015  . Humeral shaft fracture 09/05/2013   Past Surgical History  Procedure Laterality Date  . Cholecystectomy    . Orif humerus fracture Right 09/05/2013    Procedure: OPEN REDUCTION INTERNAL FIXATION (ORIF) HUMERAL SHAFT FRACTURE;  Surgeon: Cammy Copa, MD;  Location: [facility] OR;  Service: Orthopedics;  Laterality: Right;  Right Humeral Shaft Fracture Fixation   Family History  Problem Relation Age of Onset  . Cancer Mother   . Diabetes Father   . Diabetes Maternal Grandmother   . Stroke Maternal Grandmother   . Diabetes Paternal Grandmother   . Heart disease Paternal Grandfather      Previous Medications   No medications on file   No Known Allergies  Review of Systems  Constitutional: Positive for appetite change and fatigue.  HENT: Positive for congestion, postnasal drip, rhinorrhea and sore throat.     Respiratory: Positive for cough, choking, chest tightness, shortness of breath and wheezing.   Cardiovascular: Negative.   Gastrointestinal: Positive for vomiting (when coughing a lot) and diarrhea.  Genitourinary: Negative.   Musculoskeletal: Negative.   Skin: Negative.   Allergic/Immunologic: Positive for environmental allergies.  Neurological: Positive for headaches.  Hematological: Negative.   Psychiatric/Behavioral: Negative.     Social History  Substance Use Topics  . Smoking status: Never Smoker   . Smokeless tobacco: Not on file  . Alcohol Use: 0.0 oz/week    0 Standard drinks or equivalent per week     Comment: occasionally   Objective:   BP 112/70 mmHg  Pulse 67  Temp(Src) 98.8 F (37.1 C) (Oral)  Resp 16  Ht  (1.549 m)  Wt 211 lb 6.4 oz (95.89 kg)  BMI 39.96 kg/m2  SpO2 98%  LMP 08/20/2015  Physical Exam  Constitutional: She is oriented to person, place, and time. She appears well-nourished.  HENT:  Head: Normocephalic.  Right Ear: External ear normal.  Left Ear: External ear normal.  Slight cobblestoning of posterior pharynx and cloudy right maxillary sinus to transilluminate.  Eyes: Conjunctivae and EOM are normal.  Neck: Normal range of motion. Neck supple.  Cardiovascular: Normal rate and regular rhythm.   Pulmonary/Chest: Effort normal.  Slightly coarse breath sounds with minimal wheeze that clears with a cough.  Abdominal: Soft. Bowel sounds are normal.  Musculoskeletal:  Well healed scar right upper inner arm from past humerus fracture with  surgical fixation.  Lymphadenopathy:    She has no cervical adenopathy.  Neurological: She is alert and oriented to person, place, and time.  Skin: No rash noted.      Assessment & Plan:     1. Subacute maxillary sinusitis Recurrent over the past week with some chills during this episode but no documented fever. Cloudy right maxillary sinus to transilluminate and coarse breath sounds with ticklish  cough. Will treat with Amoxil and Hycodan cough syrup. May use Tylenol or Advil prn and increase fluid intake. Recheck if no better in 1 week. - HYDROcodone-homatropine (HYCODAN) 5-1.5 MG/5ML syrup; Take 5 mLs by mouth every 8 (eight) hours as needed for cough.  Dispense: 120 mL; Refill: 0 - amoxicillin (AMOXIL) 875 MG tablet; Take 1 tablet (875 mg total) by mouth 2 (two) times daily.  Dispense: 20 tablet; Refill: 0  2. Seasonal allergies Usually has rhinorrhea and congestion in the Spring. May start the Zyrtec and add Flonase. Recheck prn.

## 2015-09-15 IMAGING — CR DG HUMERUS 2V *R*
1 series · 2 of 2 positions shown · non-contrast
Comparison: None available

CLINICAL DATA: Fall

EXAM:
RIGHT HUMERUS - 2+ VIEW

[Series 1: w humerus ap right · 0.14mm/px · 2 of 2 slices shown]
[im 1/2]
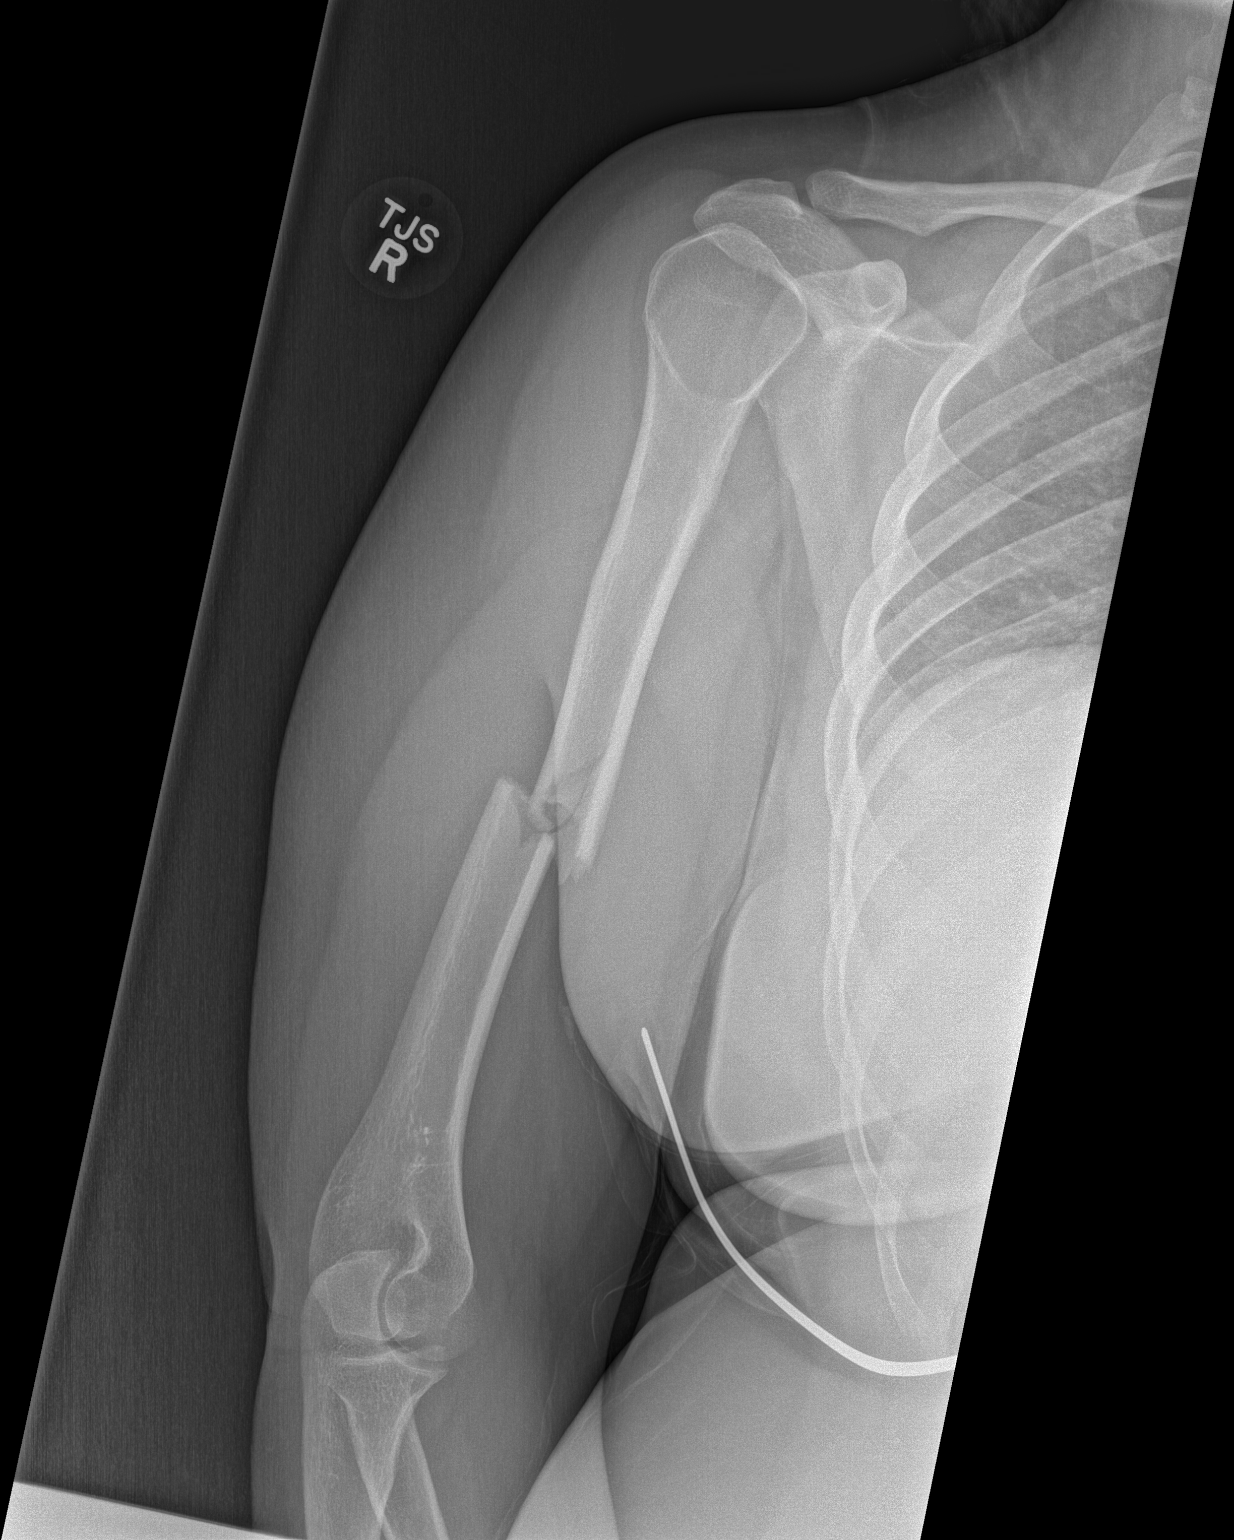
[im 2/2]
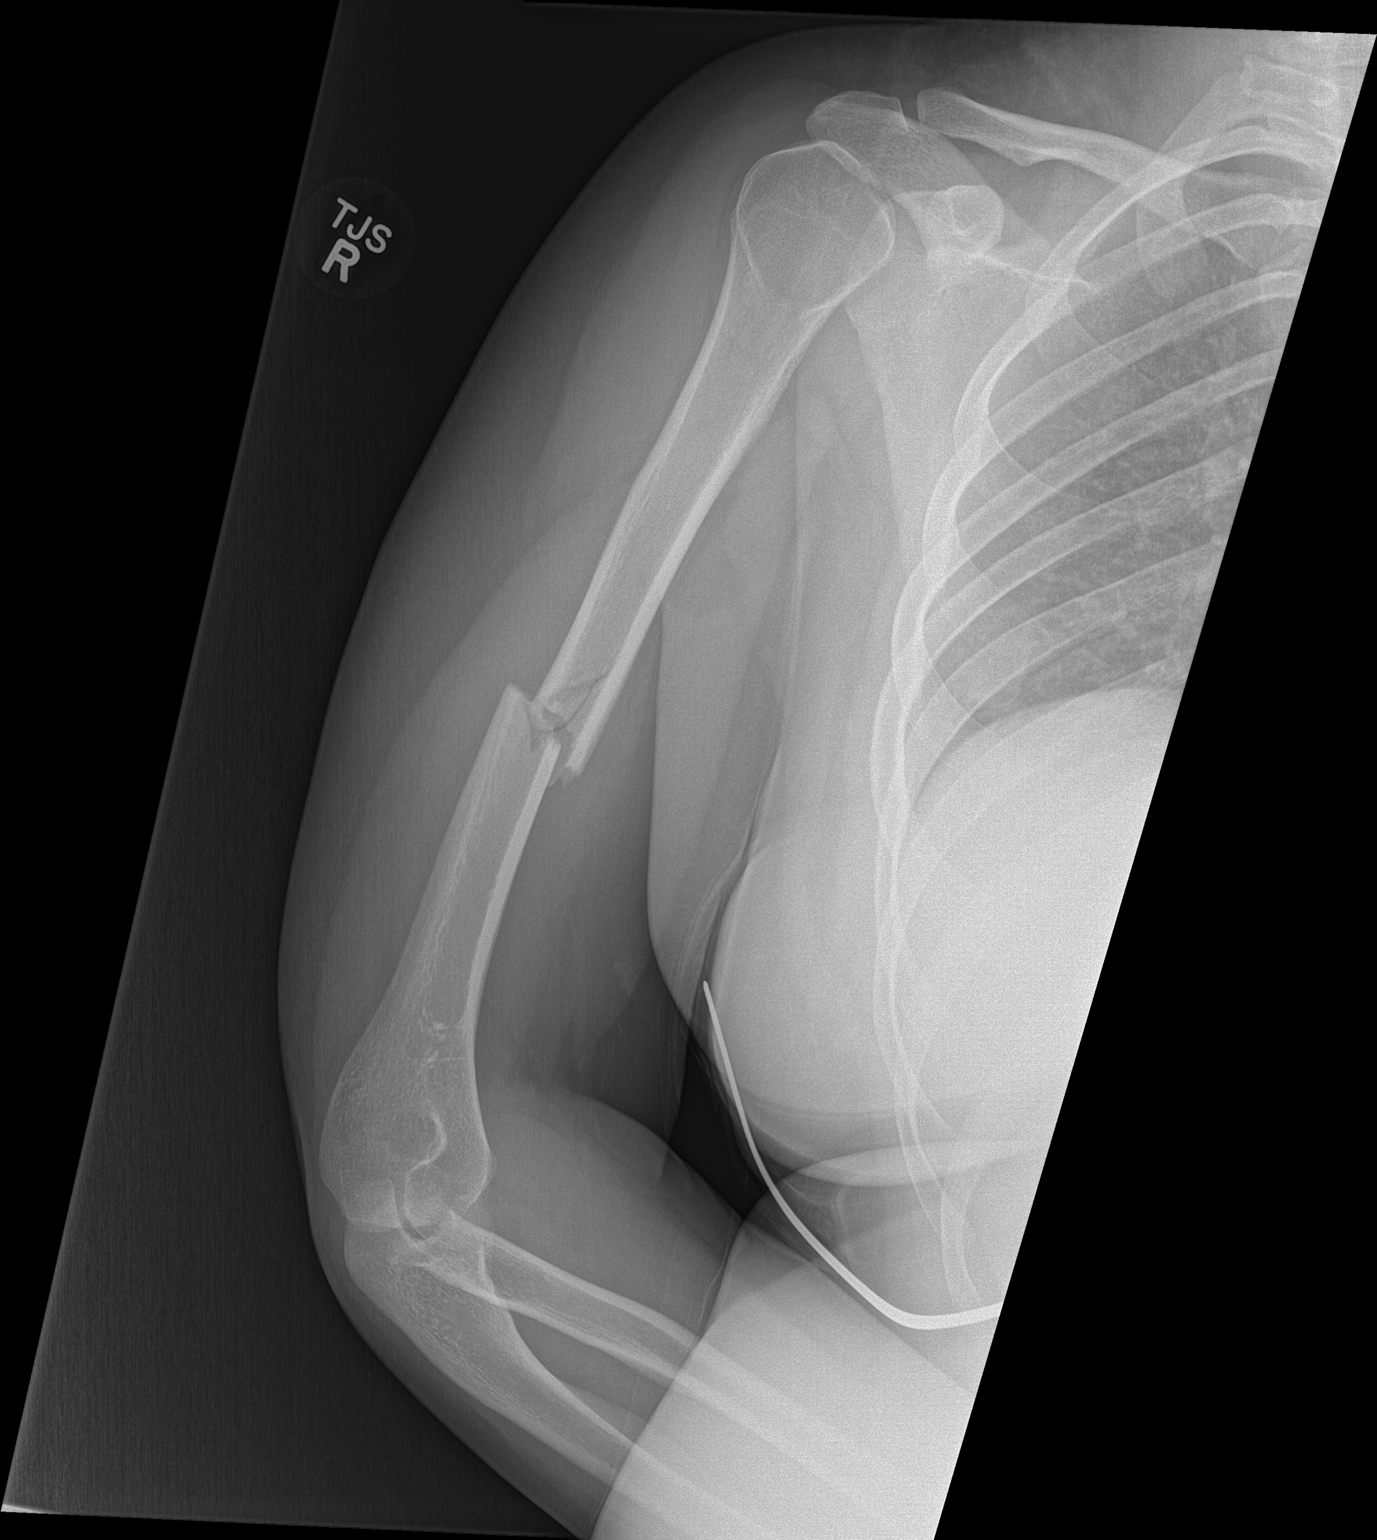

[2 of 2 positions shown; findings below may reference images not displayed]

FINDINGS: There is an acute comminuted fracture through the midshaft of the
right humerus with lateral and posterior displacement. Humeral head
remains in normal alignment with the glenoid. The right elbow is
grossly approximated. There is associated soft tissue swelling
within the mid right arm.
IMPRESSION: Acute comminuted fracture of the mid right humeral shaft with
posterior and lateral displacement.

## 2015-09-22 IMAGING — RF DG HUMERUS 2V *R*
1 series · 2 of 2 positions shown · non-contrast
Comparison: None.

CLINICAL DATA: Humeral shaft fracture.

EXAM:
DG C-ARM 1-60 MIN; RIGHT HUMERUS - 2+ VIEW

[Series 1: run · 2 of 2 slices shown]
[im 1/2]
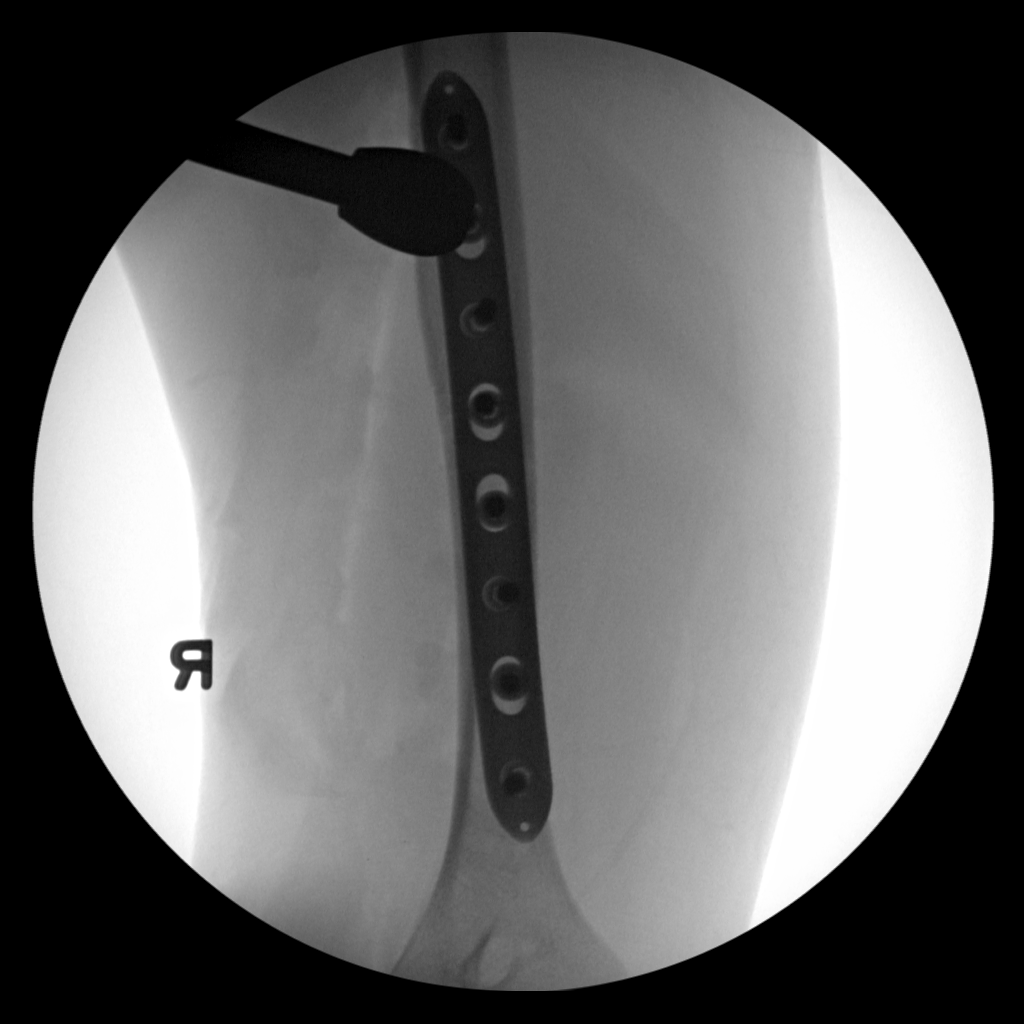
[im 2/2]
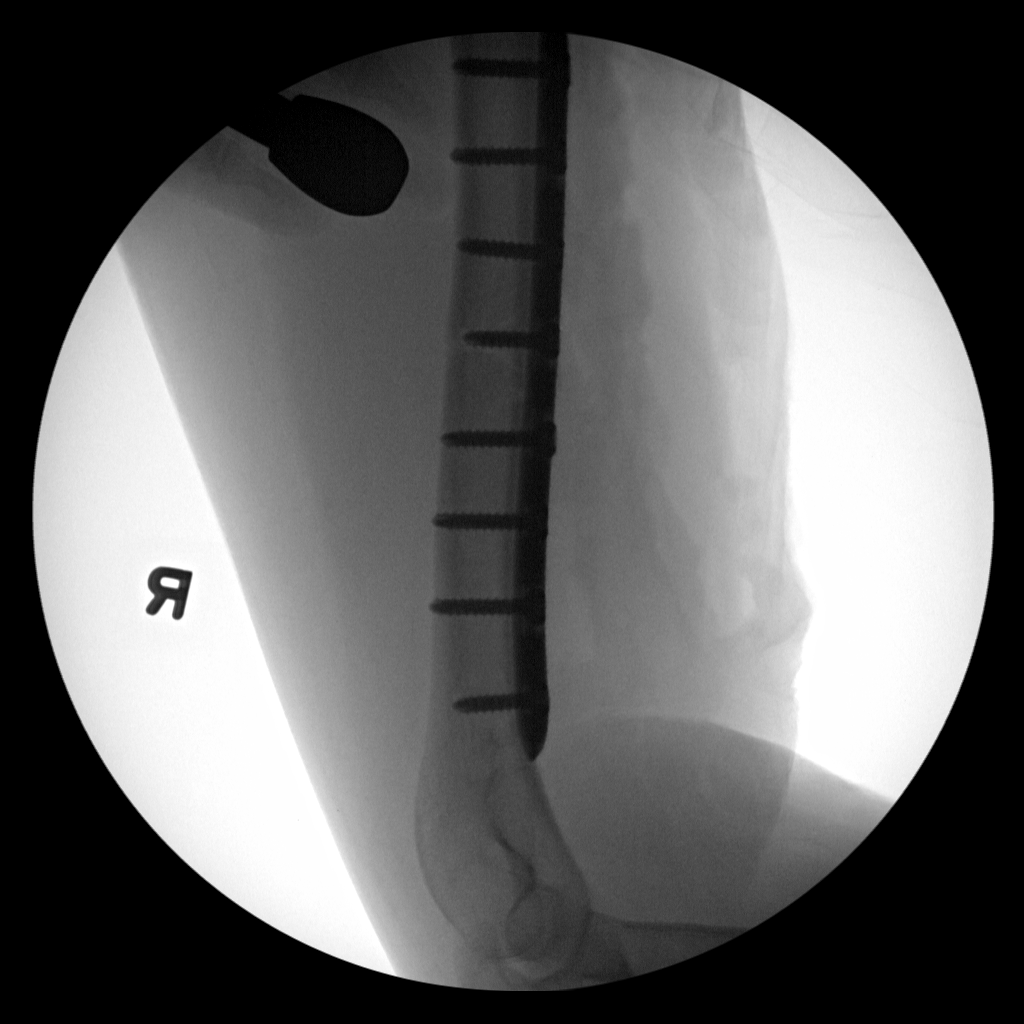

[2 of 2 positions shown; findings below may reference images not displayed]

FINDINGS: Plate and screw fixation from an anterior approach across the
humerus fracture. 2 intraoperative fluoroscopic spot views are
submitted for interpretation.
IMPRESSION: Right humerus ORIF with plate and screw fixation.

## 2015-09-22 IMAGING — CR DG SHOULDER 2+V*R*
1 series · 1 of 1 positions shown · non-contrast
Comparison: 08/29/2013.

CLINICAL DATA: Postop

EXAM:
RIGHT SHOULDER - 2+ VIEW

[AP]
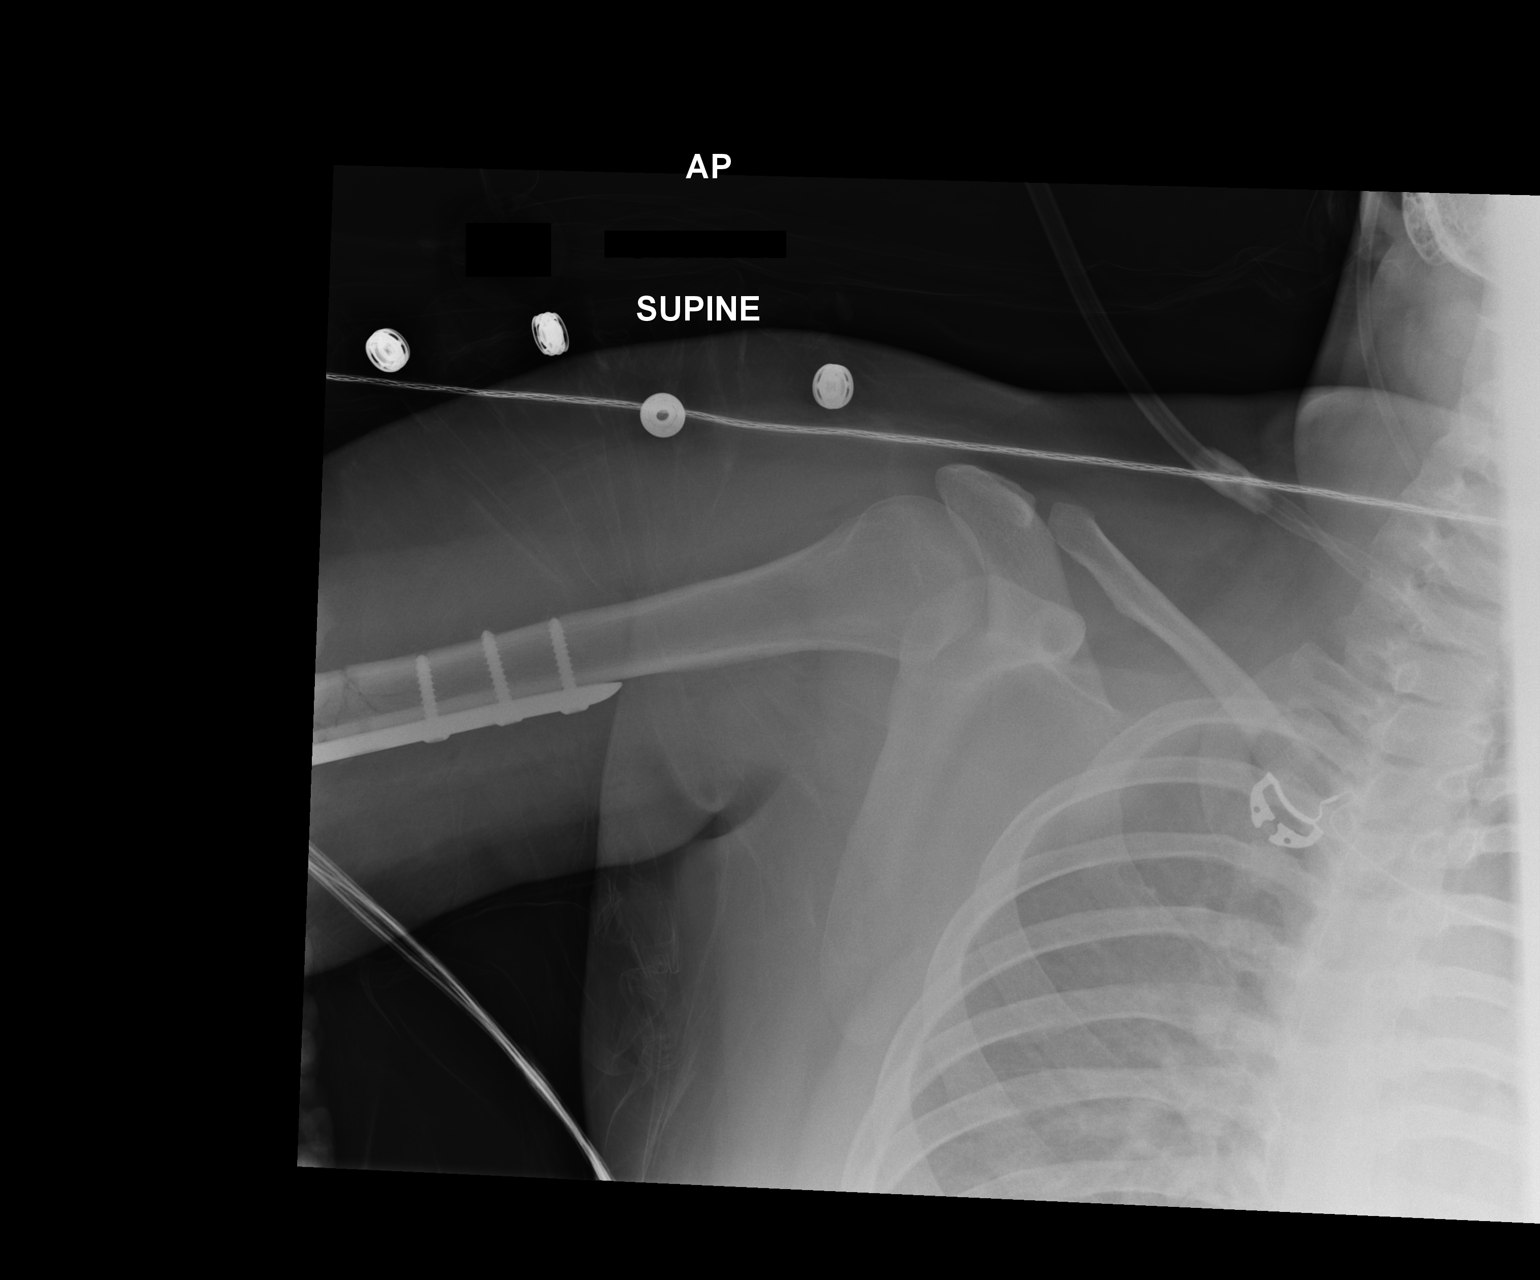

[1 of 1 positions shown; findings below may reference images not displayed]

FINDINGS: Transverse fracture of the mid aspect of the right humeral shaft has
been reduced with plate and screws. Only the proximal aspect of the
plate and screws is visualized. The fracture fragments appear much
better aligned on this single limited projection.
IMPRESSION: Transverse fracture of the mid aspect of the right humeral shaft has
been reduced with plate and screws. Only the proximal aspect of the
plate and screws is visualized. The fracture fragments appear much
better aligned on this single limited projection.

## 2022-01-07 ENCOUNTER — Encounter: Payer: Self-pay | Admitting: Obstetrics

## 2022-01-07 ENCOUNTER — Ambulatory Visit (INDEPENDENT_AMBULATORY_CARE_PROVIDER_SITE_OTHER): Payer: Medicaid Other

## 2022-01-07 VITALS — BP 110/70

## 2022-01-07 DIAGNOSIS — Z3200 Encounter for pregnancy test, result unknown: Secondary | ICD-10-CM | POA: Diagnosis not present

## 2022-01-07 LAB — POCT URINE PREGNANCY: Preg Test, Ur: POSITIVE — AB

## 2022-01-07 NOTE — Progress Notes (Signed)
Patient presents for evaluation of amenorrhea. She believes she could be pregnant.  Current symptoms also include, breast soreness  LMP :10/25/2021 Urine [id number] Briefly discussed pre-natal care options. She will schedule her appointments at check out.

## 2022-01-10 ENCOUNTER — Ambulatory Visit (INDEPENDENT_AMBULATORY_CARE_PROVIDER_SITE_OTHER): Payer: Medicaid Other

## 2022-01-10 VITALS — Wt 144.0 lb

## 2022-01-10 DIAGNOSIS — Z3480 Encounter for supervision of other normal pregnancy, unspecified trimester: Secondary | ICD-10-CM

## 2022-01-10 DIAGNOSIS — Z3403 Encounter for supervision of normal first pregnancy, third trimester: Secondary | ICD-10-CM | POA: Insufficient documentation

## 2022-01-10 DIAGNOSIS — Z3400 Encounter for supervision of normal first pregnancy, unspecified trimester: Secondary | ICD-10-CM | POA: Insufficient documentation

## 2022-01-10 DIAGNOSIS — Z3A00 Weeks of gestation of pregnancy not specified: Secondary | ICD-10-CM

## 2022-01-10 NOTE — Progress Notes (Signed)
New OB Intake  I connected with  [place] on 01/10/22 at 10:15 AM EDT by telephone Video Visit and verified that I am speaking with the correct person using two identifiers. Nurse is located at [facility] and pt is located at home.  I explained I am completing New OB Intake today. We discussed her EDD of 08/01/2022 that is based on LMP of 10/25/2021. Pt is G1/P0. I reviewed her allergies, medications, Medical/Surgical/OB history, and appropriate screenings. Based on history, this is a/an pregnancy uncomplicated .   Patient Active Problem List   Diagnosis Date Noted   Supervision of other normal pregnancy, antepartum 01/10/2022   Seasonal allergies 08/20/2015   Heartburn 08/20/2015   High cholesterol 08/20/2015   Humeral shaft fracture 09/05/2013    Concerns addressed today None  Delivery Plans:  Plans to deliver at [facility]  Anatomy US Explained first scheduled [place] will be around 20 weeks.   Labs Discussed genetic screening with patient. Patient declines genetic testing tat this time. Discussed possible labs to be drawn at new OB appointment.  COVID Vaccine Patient has had COVID vaccine.   Social Determinants of Health Food Insecurity: denies food insecurity Transportation: Patient denies transportation needs.  First visit review I reviewed new OB appt with pt. I explained she will have ob bloodwork and pap smear/pelvic exam if indicated. Explained pt will be seen by Paula Compton, CNM at first visit; encounter routed to appropriate provider.   Loran Senters, C[facility] 01/10/2022  10:38 AM  Clinical Staff Provider  Office Location  [facility] Dating    Language  English Anatomy US    Flu Vaccine  offer Genetic Screen  NIPS:   TDaP vaccine   offer Hgb A1C or  GTT Early : Third trimester :   Covid UTD   LAB RESULTS   Rhogam   Blood Type     Feeding Plan breast Antibody    Contraception undecided Rubella    Circumcision yes RPR      Pediatrician  undecided HBsAg     Support Person Janyth Pupa HIV    Prenatal Classes yes Varicella     GBS  (For PCN allergy, check sensitivities)   BTL Consent  Hep C   VBAC Consent  Pap      Hgb Electro      CF      SMA

## 2022-01-11 ENCOUNTER — Other Ambulatory Visit: Payer: Medicaid Other

## 2022-01-11 DIAGNOSIS — Z3480 Encounter for supervision of other normal pregnancy, unspecified trimester: Secondary | ICD-10-CM

## 2022-01-12 ENCOUNTER — Other Ambulatory Visit: Payer: Medicaid Other

## 2022-01-12 LAB — CBC/D/PLT+RPR+RH+ABO+RUBIGG...
Antibody Screen: NEGATIVE
Basophils Absolute: 0 10*3/uL (ref 0.0–0.2)
Basos: 0 %
EOS (ABSOLUTE): 0.3 10*3/uL (ref 0.0–0.4)
Eos: 3 %
HCV Ab: NONREACTIVE
HIV Screen 4th Generation wRfx: NONREACTIVE
Hematocrit: 36.4 % (ref 34.0–46.6)
Hemoglobin: 11.9 g/dL (ref 11.1–15.9)
Hepatitis B Surface Ag: NEGATIVE
Immature Grans (Abs): 0.1 10*3/uL (ref 0.0–0.1)
Immature Granulocytes: 1 %
Lymphocytes Absolute: 1.7 10*3/uL (ref 0.7–3.1)
Lymphs: 17 %
MCH: 28.5 pg (ref 26.6–33.0)
MCHC: 32.7 g/dL (ref 31.5–35.7)
MCV: 87 fL (ref 79–97)
Monocytes Absolute: 0.8 10*3/uL (ref 0.1–0.9)
Monocytes: 8 %
Neutrophils Absolute: 7.3 10*3/uL — ABNORMAL HIGH (ref 1.4–7.0)
Neutrophils: 71 %
Platelets: 232 10*3/uL (ref 150–450)
RBC: 4.18 x10E6/uL (ref 3.77–5.28)
RDW: 12.9 % (ref 11.7–15.4)
RPR Ser Ql: NONREACTIVE
Rh Factor: POSITIVE
Rubella Antibodies, IGG: 1.99 index (ref >0.99)
Varicella zoster IgG: <135 index — ABNORMAL LOW (ref >165)
WBC: 10.2 10*3/uL (ref 3.4–10.8)

## 2022-01-12 LAB — HCV INTERPRETATION

## 2022-01-20 ENCOUNTER — Other Ambulatory Visit (HOSPITAL_COMMUNITY)
Admission: RE | Admit: 2022-01-20 | Discharge: 2022-01-20 | Disposition: A | Discharge status: Home / Self Care | Payer: Medicaid Other | Source: Ambulatory Visit | Attending: Obstetrics | Admitting: Obstetrics

## 2022-01-20 ENCOUNTER — Ambulatory Visit (INDEPENDENT_AMBULATORY_CARE_PROVIDER_SITE_OTHER): Payer: Medicaid Other | Admitting: Obstetrics

## 2022-01-20 VITALS — BP 122/70 | Wt 147.0 lb

## 2022-01-20 DIAGNOSIS — Z3490 Encounter for supervision of normal pregnancy, unspecified, unspecified trimester: Secondary | ICD-10-CM | POA: Diagnosis present

## 2022-01-20 DIAGNOSIS — Z124 Encounter for screening for malignant neoplasm of cervix: Secondary | ICD-10-CM | POA: Diagnosis present

## 2022-01-20 DIAGNOSIS — Z3480 Encounter for supervision of other normal pregnancy, unspecified trimester: Secondary | ICD-10-CM

## 2022-01-20 DIAGNOSIS — Z3A12 12 weeks gestation of pregnancy: Secondary | ICD-10-CM

## 2022-01-20 NOTE — Progress Notes (Addendum)
New Obstetric Patient H&P    Chief Complaint: "Desires prenatal care"   History of Present Illness: Patient is a 31 y.o. G1P0 Not Hispanic or Latino female, LMP 10/25/2021 presents with amenorrhea and positive home pregnancy test. Based on her  LMP, her EDD is Estimated Date of Delivery: 08/01/22 and her EGA is [redacted]w[redacted]d. Cycles are 5. days, regular, and occur approximately every : 28 days. Her last pap smear was  a long time ago :)  years ago and was no abnormalities.    She had a urine pregnancy test which was positive about 1  month(s)  ago. Her last menstrual period was normal and lasted for  about 5  day(s). Since her LMP she claims she has experienced breast tenderness, fatigue and a change in er appetite. She denies vaginal bleeding. Her past medical history is noncontributory. Her prior pregnancies are notable for none  Since her LMP, she admits to the use of tobacco products  no She claims she has gained   no pounds since the start of her pregnancy.  There are cats in the home in the home  no She admits close contact with children on a regular basis  no  She has had chicken pox in the past no She has had Tuberculosis exposures, symptoms, or previously tested positive for TB   no Current or past history of domestic violence. no  Genetic Screening/Teratology Counseling: (Includes patient, baby's father, or anyone in either family with:)   1. Patient's age >/= 31 at [facility]  no 2. Thalassemia ([place], [place], Mediterranean, or Asian background): MCV<80  no 3. Neural tube defect (meningomyelocele, spina bifida, anencephaly)  no 4. Congenital heart defect  no  5. Down syndrome  no 6. Tay-Sachs (Jewish, [place]  no 7. Canavan's Disease  no 8. Sickle cell disease or trait (African)  no  9. Hemophilia or other blood disorders  no  10. Muscular dystrophy  no  11. Cystic fibrosis  no  12. Huntington's Chorea  no  13. Mental retardation/autism  no 14. Other inherited genetic  or chromosomal disorder  no 15. Maternal metabolic disorder (DM, PKU, etc)  no 16. Patient or FOB with a child with a birth defect not listed above no  16a. Patient or FOB with a birth defect themselves no 17. Recurrent pregnancy loss, or stillbirth  no  18. Any medications since LMP other than prenatal vitamins (include vitamins, supplements, OTC meds, drugs, alcohol)  no 19. Any other genetic/environmental exposure to discuss  no  Infection History:   1. Lives with someone with TB or TB exposed  no  2. Patient or partner has history of genital herpes  no 3. Rash or viral illness since LMP  no 4. History of STI (GC, CT, HPV, syphilis, HIV)  no 5. History of recent travel :  no  Other pertinent information:  no     Review of Systems:10 point review of systems negative unless otherwise noted in HPI  Past Medical History:  Past Medical History:  Diagnosis Date   Medical history non-contributory     Past Surgical History:  Past Surgical History:  Procedure Laterality Date   CHOLECYSTECTOMY     ORIF HUMERUS FRACTURE Right 09/05/2013   Procedure: OPEN REDUCTION INTERNAL FIXATION (ORIF) HUMERAL SHAFT FRACTURE;  Surgeon: Cammy Copa, MD;  Location: [facility] OR;  Service: Orthopedics;  Laterality: Right;  Right Humeral Shaft Fracture Fixation    Gynecologic History: Patient's last menstrual  period was 10/25/2021 (exact date).  Obstetric History: G1P0  Family History:  Family History  Problem Relation Age of Onset   Cancer Mother    Cancer Father 64       colon   Diabetes Father    Polycystic ovary syndrome Sister    Diabetes Maternal Grandmother    Stroke Maternal Grandmother    Heart disease Maternal Grandfather    Diabetes Paternal Grandmother    Heart disease Paternal Grandfather     Social History:  Social History   Socioeconomic History   Marital status: Married    Spouse name: Janyth Pupa   Number of children: 0   Years of education: 13   Highest education  level: Not on file  Occupational History   Occupation: Agricultural engineer  Tobacco Use   Smoking status: Never   Smokeless tobacco: Never  Vaping Use   Vaping Use: Never used  Substance and Sexual Activity   Alcohol use: Not Currently    Comment: occasionally   Drug use: No   Sexual activity: Yes    Birth control/protection: None  Other Topics Concern   Not on file  Social History Narrative   Not on file   Social Determinants of Health   Financial Resource Strain: Low Risk  (01/10/2022)   Overall Financial Resource Strain (CARDIA)    Difficulty of Paying Living Expenses: Not hard at all  Food Insecurity: No Food Insecurity (01/10/2022)   Hunger Vital Sign    Worried About Running Out of Food in the Last Year: Never true    Ran Out of Food in the Last Year: Never true  Transportation Needs: No Transportation Needs (01/10/2022)   PRAPARE - Administrator, Civil Service (Medical): No    Lack of Transportation (Non-Medical): No  Physical Activity: Insufficiently Active (01/10/2022)   Exercise Vital Sign    Days of Exercise per Week: 2 days    Minutes of Exercise per Session: 30 min  Stress: No Stress Concern Present (01/10/2022)   [employer] of Occupational Health - Occupational Stress Questionnaire    Feeling of Stress : Not at all  Social Connections: Moderately Isolated (01/10/2022)   Social Connection and Isolation Panel [NHANES]    Frequency of Communication with Friends and Family: Three times a week    Frequency of Social Gatherings with Friends and Family: Twice a week    Attends Religious Services: Never    Database administrator or Organizations: No    Attends Banker Meetings: Never    Marital Status: Married  Catering manager Violence: Not At Risk (01/10/2022)   Humiliation, Afraid, Rape, and Kick questionnaire    Fear of Current or Ex-Partner: No    Emotionally Abused: No    Physically Abused: No    Sexually Abused: No     Allergies:  Allergies  Allergen Reactions   Other     seasonal    Medications: Prior to Admission medications   Medication Sig Start Date End Date Taking? Authorizing Provider  Prenatal Vit-Fe Fumarate-FA (PRENATAL VITAMINS) 28-0.8 MG TABS Take 1 tablet by mouth daily.    [provider]    Physical Exam Vitals: Blood pressure 122/70, weight 147 lb (66.7 kg), last menstrual period 10/25/2021.  General: NAD HEENT: normocephalic, anicteric Thyroid: no enlargement, no palpable nodules Pulmonary: No increased work of breathing, CTAB Cardiovascular: RRR, distal pulses 2+ Abdomen: NABS, soft, non-tender, non-distended.  Umbilicus without lesions.  No hepatomegaly, splenomegaly or masses palpable.  No evidence of hernia  Genitourinary:  External: Normal external female genitalia.  Normal urethral meatus, normal  Bartholin's and Skene's glands.    Vagina: Normal vaginal mucosa, no evidence of prolapse.    Cervix: Grossly normal in appearance, no bleeding  Uterus: anteverted,-enlarged, mobile, normal contour.  No CMT  Adnexa: ovaries non-enlarged, no adnexal masses  Rectal: deferred Extremities: no edema, erythema, or tenderness Neurologic: Grossly intact Psychiatric: mood appropriate, affect full   Assessment: 31 y.o. G1P0 at [redacted]w[redacted]d presenting to initiate prenatal care  Plan: 1) Avoid alcoholic beverages. 2) Patient encouraged not to smoke.  3) Discontinue the use of all non-medicinal drugs and chemicals.  4) Take prenatal vitamins daily.  5) Nutrition, food safety (fish, cheese advisories, and high nitrite foods) and exercise discussed. 6) Hospital and practice style discussed with cross coverage system.  7) Genetic Screening, such as with 1st Trimester Screening, cell free fetal DNA, AFP testing, and Ultrasound, as well as with amniocentesis and CVS as appropriate, is discussed with patient. At the conclusion of today's visit patient declined genetic testing 8)  Patient is asked about travel to areas at risk for the [place] virus, and counseled to avoid travel and exposure to mosquitoes or sexual partners who may have themselves been exposed to the virus. Testing is discussed, and will be ordered as appropriate.  Pap smear and cultures retrieved today. The following were addressed during this visit:  Breastfeeding Education - Early initiation of breastfeeding    Comments: Keeps milk supply adequate, helps contract uterus and slow bleeding, and early milk is the perfect first food and is easy to digest.   - The importance of exclusive breastfeeding    Comments: Provides antibodies, Lower risk of breast and ovarian cancers, and type-2 diabetes,Helps your body recover, Reduced chance of SIDS.   - Risks of giving your baby anything other than breast milk if you are breastfeeding    Comments: Make the baby less content with breastfeeds, may make my baby more susceptible to illness, and may reduce my milk supply.   - Nonpharmacological pain relief methods for labor    Comments: Deep breathing, focusing on pleasant things, movement and walking, heating pads or cold compress, massage and relaxation, continuous support from someone you trust, and Doulas   - The importance of early skin-to-skin contact    Comments: Keeps baby warm and secure, helps keep baby's blood sugar up and breathing steady, easier to bond and breastfeed, and helps calm baby.  - Rooming-in on a 24-hour basis    Comments: Easier to learn baby's feeding cues, easier to bond and get to know each other, and encourages milk production.   - Feeding on demand or baby-led feeding    Comments: Helps prevent breastfeeding complications, helps bring in good milk supply, prevents under or overfeeding, and helps baby feel content and satisfied   - Frequent feeding to help assure optimal milk production    Comments: Making a full supply of milk requires frequent removal of milk from breasts,  infant will eat 8-12 times in 24 hours, if separated from infant use breast massage, hand expression and/ or pumping to remove milk from breasts.   - Effective positioning and attachment    Comments: Helps my baby to get enough breast milk, helps to produce an adequate milk supply, and helps prevent nipple pain and damage   - Exclusive breastfeeding for the first 6 months    Comments: Builds a healthy milk supply and keeps it up, protects  baby from sickness and disease, and breastmilk has everything your baby needs for the first 6 months.  - Individualized Education    Comments: Contraindications to breastfeeding and other special medical conditions   A dating scan is ordered for her to be done if office if possible. RTC in 4 weeks for ROB All questions answered. Encouraged to take perinatal ed or view videos, read up on labor and birth this pregnancy.  Mirna Mires, CNM  01/20/2022 12:43 PM

## 2022-01-20 NOTE — Progress Notes (Signed)
NOB today.  

## 2022-01-21 LAB — CERVICOVAGINAL ANCILLARY ONLY
Bacterial Vaginitis (gardnerella): NEGATIVE
Candida Glabrata: NEGATIVE
Candida Vaginitis: NEGATIVE
Chlamydia: NEGATIVE
Comment: NEGATIVE
Comment: NEGATIVE
Comment: NEGATIVE
Comment: NEGATIVE
Comment: NEGATIVE
Comment: NORMAL
Neisseria Gonorrhea: NEGATIVE
Trichomonas: NEGATIVE

## 2022-01-21 LAB — CYTOLOGY - PAP
Comment: NEGATIVE
Diagnosis: NEGATIVE
High risk HPV: NEGATIVE

## 2022-01-27 ENCOUNTER — Ambulatory Visit
Admission: RE | Admit: 2022-01-27 | Discharge: 2022-01-27 | Disposition: A | Discharge status: Home / Self Care | Payer: Medicaid Other | Source: Ambulatory Visit | Attending: Obstetrics | Admitting: Obstetrics

## 2022-01-27 ENCOUNTER — Other Ambulatory Visit: Payer: Self-pay | Admitting: Obstetrics

## 2022-01-27 DIAGNOSIS — Z3480 Encounter for supervision of other normal pregnancy, unspecified trimester: Secondary | ICD-10-CM | POA: Insufficient documentation

## 2022-01-27 DIAGNOSIS — Z3687 Encounter for antenatal screening for uncertain dates: Secondary | ICD-10-CM | POA: Diagnosis present

## 2022-01-27 DIAGNOSIS — Z3A12 12 weeks gestation of pregnancy: Secondary | ICD-10-CM | POA: Diagnosis not present

## 2022-02-17 ENCOUNTER — Encounter: Payer: Self-pay | Admitting: Obstetrics & Gynecology

## 2022-02-17 ENCOUNTER — Ambulatory Visit (INDEPENDENT_AMBULATORY_CARE_PROVIDER_SITE_OTHER): Payer: Medicaid Other | Admitting: Obstetrics & Gynecology

## 2022-02-17 VITALS — BP 110/50 | Wt 155.2 lb

## 2022-02-17 DIAGNOSIS — Z3A14 14 weeks gestation of pregnancy: Secondary | ICD-10-CM

## 2022-02-17 DIAGNOSIS — Z1379 Encounter for other screening for genetic and chromosomal anomalies: Secondary | ICD-10-CM

## 2022-03-01 ENCOUNTER — Other Ambulatory Visit: Payer: Medicaid Other

## 2022-03-03 LAB — AFP, SERUM, OPEN SPINA BIFIDA
AFP MoM: 0.93
AFP Value: 32.1 ng/mL
Gest. Age on Collection Date: 16.4 weeks
Maternal Age At EDD: 31.2 yr
OSBR Risk 1 IN: 10000
Test Results:: NEGATIVE
Weight: 162 [lb_av]

## 2022-03-13 NOTE — Progress Notes (Signed)
  Subjective:    Nancy Huffman is a 31 y.o. female being seen today for her obstetrical visit. She is at [redacted]w[redacted]d gestation. Patient reports no bleeding, no contractions, no cramping, and no leaking. Fetal movement: normal.  Menstrual History: OB History     Gravida  1   Para      Term      Preterm      AB      Living         SAB      IAB      Ectopic      Multiple      Live Births               Patient's last menstrual period was 10/25/2021 (exact date).    The following portions of the patient's history were reviewed and updated as appropriate: allergies, current medications, past family history, past medical history, past social history, past surgical history, and problem list.  Review of Systems A comprehensive review of systems was negative.   Objective:     BP (!) 110/50   Wt 155 lb 3.2 oz (70.4 kg)   LMP 10/25/2021 (Exact Date)   BMI 29.32 kg/m  Uterine Size: NA  Pelvic Exam:          Perineum: NA                Vulva: NA              Vagina:  NA                     pH: NA              Cervix: NA          Wet Prep: NA              Uterus: NA             Adnexa: NA     Bony Pelvis: NA     Assessment:    Pregnancy 14 and 6/7 weeks   Plan:    Problem list reviewed and updated. Labs reviewed. AFP3 discussed: ordered. Role of ultrasound in pregnancy discussed; fetal survey:  discussed . Amniocentesis discussed:  Follow up in 4 weeks.  Marland Kitchen

## 2022-03-17 ENCOUNTER — Encounter: Payer: Self-pay | Admitting: Licensed Practical Nurse

## 2022-03-17 ENCOUNTER — Ambulatory Visit (INDEPENDENT_AMBULATORY_CARE_PROVIDER_SITE_OTHER): Payer: Medicaid Other | Admitting: Licensed Practical Nurse

## 2022-03-17 VITALS — BP 120/70 | Wt 160.0 lb

## 2022-03-17 DIAGNOSIS — Z3A18 18 weeks gestation of pregnancy: Secondary | ICD-10-CM

## 2022-03-17 DIAGNOSIS — Z3402 Encounter for supervision of normal first pregnancy, second trimester: Secondary | ICD-10-CM

## 2022-03-17 NOTE — Progress Notes (Signed)
Routine Prenatal Care Visit  Subjective  Nancy Huffman is a 31 y.o. G1P0 at [redacted]w[redacted]d being seen today for ongoing prenatal care.  She is currently monitored for the following issues for this low-risk pregnancy and has Humeral shaft fracture; Seasonal allergies; Heartburn; High cholesterol; and Supervision of other normal pregnancy, antepartum on their problem list.  ----------------------------------------------------------------------------------- Patient reports no complaints.  Here with husband. Desires to breastfeed, looking for CBE and BF classes  -Traveling to [place] for a wedding at the end of the month, precautions given.  Contractions: Not present. Vag. Bleeding: None.  Movement: Absent. Leaking Fluid denies.  ----------------------------------------------------------------------------------- The following portions of the patient's history were reviewed and updated as appropriate: allergies, current medications, past family history, past medical history, past social history, past surgical history and problem list. Problem list updated.  Objective  Blood pressure 120/70, weight 160 lb (72.6 kg), last menstrual period 10/25/2021. Pregravid weight 142 lb (64.4 kg) Total Weight Gain 18 lb (8.165 kg) Urinalysis: Urine Protein    Urine Glucose    Fetal Status: Fetal Heart Rate (bpm): 145   Movement: Absent     General:  Alert, oriented and cooperative. Patient is in no acute distress.  Skin: Skin is warm and dry. No rash noted.   Cardiovascular: Normal heart rate noted  Respiratory: Normal respiratory effort, no problems with respiration noted  Abdomen: Soft, gravid, appropriate for gestational age. Pain/Pressure: Absent     Pelvic:  Cervical exam deferred        Extremities: Normal range of motion.  Edema: None  Mental Status: Normal mood and affect. Normal behavior. Normal judgment and thought content.   Assessment   31 y.o. G1P0 at [redacted]w[redacted]d by  08/12/2022, by Ultrasound presenting  for routine prenatal visit  Plan   first Problems (from 01/10/22 to present)     No problems associated with this episode.        Preterm labor symptoms and general obstetric precautions including but not limited to vaginal bleeding, contractions, leaking of fluid and fetal movement were reviewed in detail with the patient. Please refer to After Visit Summary for other counseling recommendations.   Return in about 4 weeks (around 04/14/2022) for ROB.  Anatomy US ordered  Carie Caddy, CNM  Domingo Pulse, [facility]  03/17/22  11:24 AM

## 2022-03-28 ENCOUNTER — Ambulatory Visit
Admission: RE | Admit: 2022-03-28 | Discharge: 2022-03-28 | Disposition: A | Discharge status: Home / Self Care | Payer: Medicaid Other | Source: Ambulatory Visit | Attending: Licensed Practical Nurse | Admitting: Licensed Practical Nurse

## 2022-03-28 DIAGNOSIS — Z3A20 20 weeks gestation of pregnancy: Secondary | ICD-10-CM | POA: Insufficient documentation

## 2022-03-28 DIAGNOSIS — Z3689 Encounter for other specified antenatal screening: Secondary | ICD-10-CM | POA: Insufficient documentation

## 2022-03-28 DIAGNOSIS — Z3402 Encounter for supervision of normal first pregnancy, second trimester: Secondary | ICD-10-CM | POA: Insufficient documentation

## 2022-04-14 ENCOUNTER — Ambulatory Visit (INDEPENDENT_AMBULATORY_CARE_PROVIDER_SITE_OTHER): Payer: Medicaid Other | Admitting: Obstetrics

## 2022-04-14 ENCOUNTER — Encounter: Payer: Medicaid Other | Admitting: Obstetrics & Gynecology

## 2022-04-14 VITALS — BP 119/61 | HR 82 | Wt 180.0 lb

## 2022-04-14 DIAGNOSIS — Z3A22 22 weeks gestation of pregnancy: Secondary | ICD-10-CM

## 2022-04-14 DIAGNOSIS — Z3402 Encounter for supervision of normal first pregnancy, second trimester: Secondary | ICD-10-CM

## 2022-04-14 NOTE — Progress Notes (Signed)
Routine Prenatal Care Visit  Subjective  Nancy Huffman is a 31 y.o. G1P0 at [redacted]w[redacted]d being seen today for ongoing prenatal care.  She is currently monitored for the following issues for this low-risk pregnancy and has Humeral shaft fracture; Seasonal allergies; Heartburn; High cholesterol; and Supervision of other normal pregnancy, antepartum on their problem list.  ----------------------------------------------------------------------------------- Patient reports no complaints.  She is naming her daughter Hal Morales. The FOB is here today. They have just returned form [place].Feeling the baby move well. Contractions: Not present. Vag. Bleeding: None.  Movement: Present. Leaking Fluid denies.  ----------------------------------------------------------------------------------- The following portions of the patient's history were reviewed and updated as appropriate: allergies, current medications, past family history, past medical history, past social history, past surgical history and problem list. Problem list updated.  Objective  Blood pressure 119/61, pulse 82, weight 81.6 kg, last menstrual period 10/25/2021. Pregravid weight 64.4 kg Total Weight Gain 17.2 kg Urinalysis: Urine Protein    Urine Glucose    Fetal Status:     Movement: Present     General:  Alert, oriented and cooperative. Patient is in no acute distress.  Skin: Skin is warm and dry. No rash noted.   Cardiovascular: Normal heart rate noted  Respiratory: Normal respiratory effort, no problems with respiration noted  Abdomen: Soft, gravid, appropriate for gestational age. Pain/Pressure: Absent     Pelvic:  Cervical exam deferred        Extremities: Normal range of motion.     Mental Status: Normal mood and affect. Normal behavior. Normal judgment and thought content.   Assessment   31 y.o. G1P0 at [redacted]w[redacted]d by  08/12/2022, by Ultrasound presenting for routine prenatal visit  Plan   first Problems (from 01/10/22 to  present)    No problems associated with this episode.       Preterm labor symptoms and general obstetric precautions including but not limited to vaginal bleeding, contractions, leaking of fluid and fetal movement were reviewed in detail with the patient. Please refer to After Visit Summary for other counseling recommendations.  She will have her 28 week labs next visit.  No follow-ups on file.  Imagene Riches, CNM  04/14/2022 2:47 PM

## 2022-05-12 ENCOUNTER — Other Ambulatory Visit: Payer: Self-pay

## 2022-05-12 ENCOUNTER — Encounter: Payer: Managed Care, Other (non HMO) | Admitting: Obstetrics and Gynecology

## 2022-05-12 ENCOUNTER — Other Ambulatory Visit: Payer: Managed Care, Other (non HMO)

## 2022-05-12 DIAGNOSIS — Z131 Encounter for screening for diabetes mellitus: Secondary | ICD-10-CM

## 2022-05-12 DIAGNOSIS — Z3402 Encounter for supervision of normal first pregnancy, second trimester: Secondary | ICD-10-CM

## 2022-05-13 ENCOUNTER — Ambulatory Visit (INDEPENDENT_AMBULATORY_CARE_PROVIDER_SITE_OTHER): Payer: Medicaid Other | Admitting: Obstetrics and Gynecology

## 2022-05-13 ENCOUNTER — Encounter: Payer: Self-pay | Admitting: Obstetrics and Gynecology

## 2022-05-13 ENCOUNTER — Other Ambulatory Visit: Payer: Managed Care, Other (non HMO)

## 2022-05-13 VITALS — BP 105/69 | HR 86 | Wt 192.7 lb

## 2022-05-13 DIAGNOSIS — Z23 Encounter for immunization: Secondary | ICD-10-CM | POA: Diagnosis not present

## 2022-05-13 DIAGNOSIS — O09899 Supervision of other high risk pregnancies, unspecified trimester: Secondary | ICD-10-CM | POA: Insufficient documentation

## 2022-05-13 DIAGNOSIS — Z2821 Immunization not carried out because of patient refusal: Secondary | ICD-10-CM

## 2022-05-13 DIAGNOSIS — Z3A27 27 weeks gestation of pregnancy: Secondary | ICD-10-CM

## 2022-05-13 DIAGNOSIS — Z3402 Encounter for supervision of normal first pregnancy, second trimester: Secondary | ICD-10-CM

## 2022-05-13 DIAGNOSIS — Z2839 Other underimmunization status: Secondary | ICD-10-CM | POA: Insufficient documentation

## 2022-05-13 LAB — POCT URINALYSIS DIPSTICK OB
Bilirubin, UA: NEGATIVE
Blood, UA: NEGATIVE
Glucose, UA: NEGATIVE
Ketones, UA: NEGATIVE
Leukocytes, UA: NEGATIVE
Nitrite, UA: NEGATIVE
POC,PROTEIN,UA: NEGATIVE
Spec Grav, UA: 1.015 (ref 1.010–1.025)
Urobilinogen, UA: 0.2 E.U./dL
pH, UA: 6 (ref 5.0–8.0)

## 2022-05-13 NOTE — Progress Notes (Signed)
ROB. She states daily fetal moment, no pain or pressure. Patient completed GCT, received TDAP injection and signed BTC today. Patient states no questions or concerns at this time.

## 2022-05-13 NOTE — Progress Notes (Signed)
ROB: Doing well, no issues. For 28 week labs today.  Plans to breastfeed, desires Undecided method for contraception. For Tdap today, signed blood consent. Declines flu vaccine. Discussed pain management in labor, is considering epidural. RTC in 2 weeks.

## 2022-05-14 LAB — 28 WEEK RH+PANEL
Basophils Absolute: 0 10*3/uL (ref 0.0–0.2)
Basos: 0 %
EOS (ABSOLUTE): 0.3 10*3/uL (ref 0.0–0.4)
Eos: 3 %
Gestational Diabetes Screen: 78 mg/dL (ref 70–139)
HIV Screen 4th Generation wRfx: NONREACTIVE
Hematocrit: 28.9 % — ABNORMAL LOW (ref 34.0–46.6)
Hemoglobin: 9.6 g/dL — ABNORMAL LOW (ref 11.1–15.9)
Immature Grans (Abs): 0.1 10*3/uL (ref 0.0–0.1)
Immature Granulocytes: 1 %
Lymphocytes Absolute: 1.3 10*3/uL (ref 0.7–3.1)
Lymphs: 14 %
MCH: 27.4 pg (ref 26.6–33.0)
MCHC: 33.2 g/dL (ref 31.5–35.7)
MCV: 82 fL (ref 79–97)
Monocytes Absolute: 0.8 10*3/uL (ref 0.1–0.9)
Monocytes: 8 %
Neutrophils Absolute: 6.8 10*3/uL (ref 1.4–7.0)
Neutrophils: 74 %
Platelets: 242 10*3/uL (ref 150–450)
RBC: 3.51 x10E6/uL — ABNORMAL LOW (ref 3.77–5.28)
RDW: 12.6 % (ref 11.7–15.4)
RPR Ser Ql: NONREACTIVE
WBC: 9.3 10*3/uL (ref 3.4–10.8)

## 2022-05-16 ENCOUNTER — Encounter: Payer: Self-pay | Admitting: Obstetrics and Gynecology

## 2022-05-17 ENCOUNTER — Encounter: Payer: Self-pay | Admitting: Obstetrics

## 2022-05-17 DIAGNOSIS — O99013 Anemia complicating pregnancy, third trimester: Secondary | ICD-10-CM | POA: Insufficient documentation

## 2022-05-30 ENCOUNTER — Ambulatory Visit (INDEPENDENT_AMBULATORY_CARE_PROVIDER_SITE_OTHER): Payer: Medicaid Other | Admitting: Certified Nurse Midwife

## 2022-05-30 ENCOUNTER — Encounter: Payer: Self-pay | Admitting: Certified Nurse Midwife

## 2022-05-30 VITALS — BP 104/70 | HR 86 | Wt 198.1 lb

## 2022-05-30 DIAGNOSIS — Z3483 Encounter for supervision of other normal pregnancy, third trimester: Secondary | ICD-10-CM

## 2022-05-30 DIAGNOSIS — Z3A29 29 weeks gestation of pregnancy: Secondary | ICD-10-CM

## 2022-05-30 LAB — POCT URINALYSIS DIPSTICK OB
Bilirubin, UA: NEGATIVE
Blood, UA: NEGATIVE
Glucose, UA: NEGATIVE
Ketones, UA: NEGATIVE
Leukocytes, UA: NEGATIVE
Nitrite, UA: NEGATIVE
Spec Grav, UA: <=1.005 — AB (ref 1.010–1.025)
Urobilinogen, UA: 0.2 E.U./dL
pH, UA: 8.5 — AB (ref 5.0–8.0)

## 2022-05-30 NOTE — Progress Notes (Signed)
ROB doing well, pt notes she has had some ankle swelling. Discussed possible due to holiday meal with excessive salt intake. Encourage PO hydration , elevation of feet when possible and use of compression socks. She verbalizes and agrees. Follow up 2 wks for ROB.  Doreene Burke, CNM

## 2022-05-30 NOTE — Patient Instructions (Signed)
Preterm Labor Pregnancy normally lasts 39-41 weeks. Preterm labor is when labor starts before you have been pregnant for 37 weeks. Babies who are born too early may have a higher risk for long-term problems like cerebral palsy or developmental delays. They may also have problems soon after birth, such as problems with blood sugar, body temperature, heart, and breathing. These problems may be very serious in babies who are born before 34 weeks of pregnancy. What are the causes? The cause of this condition is not known. What increases the risk? You are more likely to have preterm labor if: You have medical problems, now or in the past. You have problems now or in your past pregnancies. You have lifestyle problems. Medical history You have problems of the womb (uterus). You have an infection, including infections you get from sex. You have problems that do not go away, such as: Blood clots. High blood pressure. High blood sugar. You have low body weight or too much body weight. Present and past pregnancies You have had preterm labor before. You are pregnant with two babies or more. You have a condition in which the placenta covers your cervix. You waited less than 18 months between giving birth and becoming pregnant again. Your unborn baby has some problems. You have bleeding from your vagina. You became pregnant by a method called IVF. Lifestyle You smoke. You drink alcohol. You use drugs. You have stress. You have abuse in your home. You come in contact with chemicals that harm the body (pollutants). Other factors You are younger than 17 years or older than 35 years. What are the signs or symptoms? Symptoms of this condition include: Cramps. The cramps may feel like cramps from a period. You may also have watery poop (diarrhea). Pain in the belly (abdomen). Pain in the lower back. Regular contractions. It may feel like your belly is getting tighter. Pressure in the lower  belly. More fluid leaking from the vagina. The fluid may be watery or bloody. Water breaking. How is this treated? Treatment for this condition depends on your health, the health of your baby, and how old your pregnancy is. It may include: Taking medicines, such as: Hormone medicines. Medicines to stop contractions. Medicines to help mature the baby's lungs. Medicines to prevent your baby from getting cerebral palsy or other problems. Bed rest. If the labor happens before 34 weeks of pregnancy, you may need to stay in the hospital. Delivering the baby. Follow these instructions at home:  Do not smoke or use any products that contain nicotine or tobacco. If you need help quitting, ask your doctor. Do not drink alcohol. Take over-the-counter and prescription medicines only as told by your doctor. Rest as told by your doctor. Return to your normal activities when your doctor says that it is safe. Keep all follow-up visits. How is this prevented? To have a healthy pregnancy: Do not use drugs. Do not use any medicines unless you ask your doctor if they are safe for you. Talk with your doctor before taking any herbal supplements. Make sure you gain enough weight. Watch for infection. If you think you might have an infection, get it checked right away. Symptoms of infection may include: Fever. Vaginal discharge that smells bad or is not normal. Pain or burning when you pee. Needing to pee urgently. Needing to pee often. Peeing small amounts often. Blood in your pee. Pee that smells bad or unusual. Where to find more information U.S. Department of Health and Human Services   Office on Women's Health: www.womenshealth.gov The American College of Obstetricians and Gynecologists: www.acog.org Centers for Disease Control and Prevention: www.cdc.gov Contact a doctor if: You think you are going into preterm labor. You have symptoms of preterm labor. You have symptoms of infection. Get help  right away if: You are having painful contractions every 5 minutes or less. Your water breaks. Summary Preterm labor is labor that starts before you reach 37 weeks of pregnancy. Your baby may have problems if delivered early. You are more likely to have preterm labor if you have certain medical problems or problems with a pregnancy now or in the past. Some lifestyle factors can also increase the risk. Contact a doctor if you have symptoms of preterm labor. This information is not intended to replace advice given to you by your health care provider. Make sure you discuss any questions you have with your health care provider. Document Revised: 06/23/2020 Document Reviewed: 06/23/2020 Elsevier Patient Education  2023 Elsevier Inc.  

## 2022-06-09 ENCOUNTER — Encounter: Payer: Self-pay | Admitting: Obstetrics and Gynecology

## 2022-06-09 ENCOUNTER — Ambulatory Visit (INDEPENDENT_AMBULATORY_CARE_PROVIDER_SITE_OTHER): Payer: Medicaid Other | Admitting: Obstetrics and Gynecology

## 2022-06-09 VITALS — BP 112/64 | HR 88 | Wt 202.6 lb

## 2022-06-09 DIAGNOSIS — Z3403 Encounter for supervision of normal first pregnancy, third trimester: Secondary | ICD-10-CM

## 2022-06-09 DIAGNOSIS — Z3400 Encounter for supervision of normal first pregnancy, unspecified trimester: Secondary | ICD-10-CM

## 2022-06-09 DIAGNOSIS — D649 Anemia, unspecified: Secondary | ICD-10-CM

## 2022-06-09 DIAGNOSIS — Z3A30 30 weeks gestation of pregnancy: Secondary | ICD-10-CM

## 2022-06-09 DIAGNOSIS — O99013 Anemia complicating pregnancy, third trimester: Secondary | ICD-10-CM

## 2022-06-09 LAB — POCT URINALYSIS DIPSTICK OB
Bilirubin, UA: NEGATIVE
Blood, UA: NEGATIVE
Glucose, UA: NEGATIVE
Ketones, UA: NEGATIVE
Nitrite, UA: NEGATIVE
Spec Grav, UA: 1.015 (ref 1.010–1.025)
Urobilinogen, UA: 0.2 E.U./dL
pH, UA: 7.5 (ref 5.0–8.0)

## 2022-06-09 NOTE — Progress Notes (Signed)
ROB: 31 y.o. G1P0 female at [redacted]w[redacted]d. Doing well, no issues. Is now taking iron with PNV due to anemia.  Discussed contraception, desires condoms/withdrawal method. RTC in 2 weeks.

## 2022-06-09 NOTE — Progress Notes (Signed)
ROB [redacted]w[redacted]d: She is doing well. She has good fetal movement. No new concerns today.

## 2022-06-13 ENCOUNTER — Ambulatory Visit (INDEPENDENT_AMBULATORY_CARE_PROVIDER_SITE_OTHER): Payer: Medicaid Other | Admitting: Advanced Practice Midwife

## 2022-06-13 ENCOUNTER — Encounter: Payer: Self-pay | Admitting: Advanced Practice Midwife

## 2022-06-13 VITALS — BP 122/63 | HR 85 | Wt 205.0 lb

## 2022-06-13 DIAGNOSIS — Z3A31 31 weeks gestation of pregnancy: Secondary | ICD-10-CM

## 2022-06-13 DIAGNOSIS — Z3403 Encounter for supervision of normal first pregnancy, third trimester: Secondary | ICD-10-CM

## 2022-06-13 LAB — POCT URINALYSIS DIPSTICK OB
Bilirubin, UA: NEGATIVE
Blood, UA: NEGATIVE
Glucose, UA: NEGATIVE
Ketones, UA: NEGATIVE
Nitrite, UA: NEGATIVE
Spec Grav, UA: 1.01 (ref 1.010–1.025)
Urobilinogen, UA: 0.2 E.U./dL
pH, UA: 7.5 (ref 5.0–8.0)

## 2022-06-13 NOTE — Patient Instructions (Signed)

## 2022-06-13 NOTE — Progress Notes (Signed)
Patient here with husband. She reports no concerns. Was seen 4 days ago by Dr Valentino Saxon. Doing well at that visit. She reports having 2 f/u visits scheduled close together at the time of her 28 week labs and has now kept both. She already has next visit scheduled on 12/21. She declines exam today. She reports baby is active and she has no complaints.  Tresea Mall, CNM

## 2022-06-23 ENCOUNTER — Encounter: Payer: Self-pay | Admitting: Obstetrics and Gynecology

## 2022-06-23 ENCOUNTER — Ambulatory Visit (INDEPENDENT_AMBULATORY_CARE_PROVIDER_SITE_OTHER): Payer: Medicaid Other | Admitting: Obstetrics and Gynecology

## 2022-06-23 VITALS — BP 100/68 | HR 89 | Wt 207.0 lb

## 2022-06-23 DIAGNOSIS — Z3403 Encounter for supervision of normal first pregnancy, third trimester: Secondary | ICD-10-CM

## 2022-06-23 DIAGNOSIS — Z3A33 33 weeks gestation of pregnancy: Secondary | ICD-10-CM

## 2022-06-23 NOTE — Progress Notes (Signed)
ROB. Patient states she had food poisoning Monday. But feeling better since then and staying hydrated. No vb. No LOF. Still feeling good fetal movement.

## 2022-06-23 NOTE — Progress Notes (Signed)
ROB: She reports she is doing well now.  Had food poisoning earlier this week but has been hydrating and well since that time.  Reports daily fetal movement.  Has no concerns today.

## 2022-07-04 NOTE — L&D Delivery Note (Signed)
Delivery Summary for [place]  Labor Events:   Preterm labor: No data found  Rupture date: No data found  Rupture time: No data found  Rupture type: No data found  Fluid Color: No data found  Induction: No data found  Augmentation: No data found  Complications: No data found  Cervical ripening: No data found No data found   No data found     Delivery:   Episiotomy: No data found  Lacerations: No data found  Repair suture: No data found  Repair # of packets: No data found  Blood loss (ml): 440   Information for the patient's newborn:  Zondra, [place] Girl [place] [[id number]]   Delivery 08/05/2022 8:26 AM by  C-Section, Low Transverse Sex:  female Gestational Age: [redacted]w[redacted]d Delivery Clinician:   Living?:         APGARS  One minute Five minutes Ten minutes  Skin color:        Heart rate:        Grimace:        Muscle tone:        Breathing:        Totals: 8  9      Presentation/position:      Resuscitation:   Cord information:    Disposition of cord blood:     Blood gases sent?  Complications:   Placenta: Delivered:       appearance Newborn Measurements: Weight: 7 lb 6.5 oz (3360 g)  Height: 20"  Head circumference:    Chest circumference:    Other providers:    Additional  information: Forceps:   Vacuum:   Breech:   Observed anomalies       See Dr. Andreas Blower operative note for details of procedure.   Rubie Maid, MD [facility] at [facility]

## 2022-07-07 ENCOUNTER — Encounter: Payer: Self-pay | Admitting: Obstetrics and Gynecology

## 2022-07-07 ENCOUNTER — Ambulatory Visit (INDEPENDENT_AMBULATORY_CARE_PROVIDER_SITE_OTHER): Payer: Medicaid Other | Admitting: Obstetrics and Gynecology

## 2022-07-07 VITALS — BP 120/65 | HR 78 | Wt 215.6 lb

## 2022-07-07 DIAGNOSIS — O99213 Obesity complicating pregnancy, third trimester: Secondary | ICD-10-CM

## 2022-07-07 DIAGNOSIS — Z3A34 34 weeks gestation of pregnancy: Secondary | ICD-10-CM

## 2022-07-07 DIAGNOSIS — E669 Obesity, unspecified: Secondary | ICD-10-CM

## 2022-07-07 DIAGNOSIS — Z3403 Encounter for supervision of normal first pregnancy, third trimester: Secondary | ICD-10-CM

## 2022-07-07 LAB — POCT URINALYSIS DIPSTICK OB
Bilirubin, UA: NEGATIVE
Blood, UA: NEGATIVE
Glucose, UA: NEGATIVE
Ketones, UA: NEGATIVE
Leukocytes, UA: NEGATIVE
Nitrite, UA: NEGATIVE
POC,PROTEIN,UA: NEGATIVE
Spec Grav, UA: 1.01 (ref 1.010–1.025)
Urobilinogen, UA: 0.2 E.U./dL
pH, UA: 7.5 (ref 5.0–8.0)

## 2022-07-07 NOTE — Progress Notes (Signed)
ROB [redacted]w[redacted]d: She is doing well. She reports good fetal movement. She has no new concerns.

## 2022-07-07 NOTE — Progress Notes (Signed)
ROB: Patient is a 32 y.o. G1P0 at [redacted]w[redacted]d who presents for routine care. Doing well, no issues. Does note some increase in white vaginal discharge starting today but denies any other symptoms.  Discussion had with patient regarding BMI, currently now at 40.  Reviewed recommendations for following growth, antenatal surveillance, and delivery by her due date. Patient notes understanding, and is ok with plan. Will schedule for growth scan f/BPPor next visit, will also be due for 36 week cultures. Can continue weekly surveillance with NSTs after this. RTC in 2 weeks.

## 2022-07-20 ENCOUNTER — Ambulatory Visit (INDEPENDENT_AMBULATORY_CARE_PROVIDER_SITE_OTHER): Payer: Medicaid Other | Admitting: Obstetrics and Gynecology

## 2022-07-20 ENCOUNTER — Telehealth: Payer: Self-pay | Admitting: Obstetrics and Gynecology

## 2022-07-20 ENCOUNTER — Other Ambulatory Visit: Payer: Self-pay

## 2022-07-20 ENCOUNTER — Encounter: Payer: Self-pay | Admitting: Obstetrics and Gynecology

## 2022-07-20 ENCOUNTER — Ambulatory Visit: Payer: Medicaid Other

## 2022-07-20 ENCOUNTER — Other Ambulatory Visit: Payer: Medicaid Other

## 2022-07-20 ENCOUNTER — Other Ambulatory Visit (HOSPITAL_COMMUNITY)
Admission: RE | Admit: 2022-07-20 | Discharge: 2022-07-20 | Disposition: A | Discharge status: Home / Self Care | Payer: Medicaid Other | Source: Ambulatory Visit | Attending: Obstetrics and Gynecology | Admitting: Obstetrics and Gynecology

## 2022-07-20 VITALS — BP 116/77 | HR 82 | Ht 62.0 in | Wt 221.7 lb

## 2022-07-20 VITALS — BP 116/77 | HR 82 | Wt 221.7 lb

## 2022-07-20 DIAGNOSIS — E669 Obesity, unspecified: Secondary | ICD-10-CM | POA: Diagnosis not present

## 2022-07-20 DIAGNOSIS — O99213 Obesity complicating pregnancy, third trimester: Secondary | ICD-10-CM

## 2022-07-20 DIAGNOSIS — Z3403 Encounter for supervision of normal first pregnancy, third trimester: Secondary | ICD-10-CM

## 2022-07-20 DIAGNOSIS — Z3685 Encounter for antenatal screening for Streptococcus B: Secondary | ICD-10-CM

## 2022-07-20 DIAGNOSIS — Z3A36 36 weeks gestation of pregnancy: Secondary | ICD-10-CM | POA: Diagnosis present

## 2022-07-20 DIAGNOSIS — Z113 Encounter for screening for infections with a predominantly sexual mode of transmission: Secondary | ICD-10-CM | POA: Diagnosis present

## 2022-07-20 LAB — POCT URINALYSIS DIPSTICK OB
Bilirubin, UA: NEGATIVE
Blood, UA: NEGATIVE
Glucose, UA: NEGATIVE
Ketones, UA: NEGATIVE
Nitrite, UA: NEGATIVE
POC,PROTEIN,UA: NEGATIVE
Spec Grav, UA: <=1.005 — AB (ref 1.010–1.025)
Urobilinogen, UA: 0.2 E.U./dL
pH, UA: 7 (ref 5.0–8.0)

## 2022-07-20 NOTE — Progress Notes (Signed)
    NURSE VISIT NOTE  Subjective:    Patient ID: Nancy Huffman, female    DOB: April 19, 1991, 32 y.o.   MRN: [id number]  HPI  Patient is a 32 y.o. G1P0 female who presents for fetal monitoring per order from Rubie Maid, MD.   Objective:    BP 116/77   Pulse 82   Ht 5\' 2"  (1.575 m)   Wt 221 lb 11.2 oz (100.6 kg)   LMP 10/25/2021 (Exact Date)   BMI 40.55 kg/m  Estimated Date of Delivery: 08/12/22  Assessment:   1. Encounter for supervision of normal first pregnancy in third trimester   2. Obesity affecting pregnancy in third trimester, unspecified obesity type   3. [redacted] weeks gestation of pregnancy      Plan:   Results reviewed and discussed with patient by  Jeannie Fend, MD.     Otila Kluver, LPN

## 2022-07-20 NOTE — Telephone Encounter (Signed)
I contacted patient left voicemail no ultrasound tech. Advised patient to come to scheduled office visit today at 2:15 with Dr. Amalia Hailey.

## 2022-07-20 NOTE — Progress Notes (Signed)
ROB. Patient states daily fetal movement along with occasional back pain. She has a growth ultrasound on 07/26/22. NST today due to BMI. GC/CT and GBS cultures ordered. Patient states no questions or concerns at this time.

## 2022-07-20 NOTE — Progress Notes (Signed)
ROB: Patient doing well.  Denies contractions.  Reports daily fetal movement.  NST reactive.  BPP rescheduled for next week.  Will not need NST next week.  Signs and symptoms of labor discussed.  GC/CT-GBS performed today.

## 2022-07-21 ENCOUNTER — Encounter: Payer: Self-pay | Admitting: Obstetrics and Gynecology

## 2022-07-22 LAB — CERVICOVAGINAL ANCILLARY ONLY
Chlamydia: NEGATIVE
Comment: NEGATIVE
Comment: NORMAL
Neisseria Gonorrhea: NEGATIVE

## 2022-07-22 LAB — STREP GP B NAA: Strep Gp B NAA: NEGATIVE

## 2022-07-26 ENCOUNTER — Ambulatory Visit
Admission: RE | Admit: 2022-07-26 | Discharge: 2022-07-26 | Disposition: A | Discharge status: Home / Self Care | Payer: Medicaid Other | Source: Ambulatory Visit | Attending: Obstetrics and Gynecology | Admitting: Obstetrics and Gynecology

## 2022-07-26 DIAGNOSIS — O99213 Obesity complicating pregnancy, third trimester: Secondary | ICD-10-CM | POA: Insufficient documentation

## 2022-07-26 DIAGNOSIS — Z3A37 37 weeks gestation of pregnancy: Secondary | ICD-10-CM | POA: Insufficient documentation

## 2022-07-26 NOTE — Progress Notes (Deleted)
    NURSE VISIT NOTE  Subjective:    Patient ID: Nancy Huffman, female    DOB: 03/01/1991, 32 y.o.   MRN: [id number]  HPI  Patient is a 32 y.o. G1P0 female who presents for fetal monitoring per order from Jeannie Fend, MD.   Objective:    LMP 10/25/2021 (Exact Date)  Estimated Date of Delivery: 08/12/22  Assessment:   1. Encounter for supervision of normal first pregnancy in third trimester   2. Obesity affecting pregnancy in third trimester, unspecified obesity type   3. [redacted] weeks gestation of pregnancy      Plan:   Results reviewed and discussed with patient by  Gigi Gin, CNM.     Otila Kluver, LPN

## 2022-07-26 NOTE — Patient Instructions (Incomplete)

## 2022-07-27 ENCOUNTER — Ambulatory Visit (INDEPENDENT_AMBULATORY_CARE_PROVIDER_SITE_OTHER): Payer: Medicaid Other | Admitting: Obstetrics

## 2022-07-27 ENCOUNTER — Other Ambulatory Visit: Payer: Medicaid Other

## 2022-07-27 ENCOUNTER — Other Ambulatory Visit: Payer: Self-pay | Admitting: Obstetrics and Gynecology

## 2022-07-27 ENCOUNTER — Encounter: Payer: Self-pay | Admitting: Obstetrics

## 2022-07-27 VITALS — BP 109/75 | HR 89 | Wt 223.0 lb

## 2022-07-27 DIAGNOSIS — O99213 Obesity complicating pregnancy, third trimester: Secondary | ICD-10-CM

## 2022-07-27 DIAGNOSIS — Z3A37 37 weeks gestation of pregnancy: Secondary | ICD-10-CM

## 2022-07-27 DIAGNOSIS — Z3403 Encounter for supervision of normal first pregnancy, third trimester: Secondary | ICD-10-CM

## 2022-07-27 LAB — POCT URINALYSIS DIPSTICK OB
Bilirubin, UA: NEGATIVE
Blood, UA: NEGATIVE
Glucose, UA: NEGATIVE
Ketones, UA: NEGATIVE
Nitrite, UA: NEGATIVE
Urobilinogen, UA: 0.2 E.U./dL
pH, UA: 5 (ref 5.0–8.0)

## 2022-07-27 NOTE — Progress Notes (Signed)
Routine Prenatal Care Visit  Subjective  Nancy Huffman is a 32 y.o. G1P0 at [redacted]w[redacted]d being seen today for ongoing prenatal care.  She is currently monitored for the following issues for this high-risk pregnancy and has Humeral shaft fracture; Seasonal allergies; Heartburn; High cholesterol; Encounter for supervision of normal first pregnancy in third trimester; Maternal varicella, non-immune; Anemia in pregnancy, third trimester; and Obesity affecting pregnancy in third trimester on their problem list.  ----------------------------------------------------------------------------------- Patient reports no complaints.  She had an ultrasound yesterday and the tech at [facility] told her the baby is breech. Contractions: Not present. Vag. Bleeding: None.  Movement: Present. Leaking Fluid denies.  ----------------------------------------------------------------------------------- The following portions of the patient's history were reviewed and updated as appropriate: allergies, current medications, past family history, past medical history, past social history, past surgical history and problem list. Problem list updated.  Objective  Blood pressure 109/75, pulse 89, weight 223 lb (101.2 kg), last menstrual period 10/25/2021. Pregravid weight 142 lb (64.4 kg) Total Weight Gain 81 lb (36.7 kg) Urinalysis: Urine Protein Trace  Urine Glucose Negative  Fetal Status: Fetal Heart Rate (bpm): 140s Fundal Height: 40 cm Movement: Present  Presentation: Pilar Plate Breech  General:  Alert, oriented and cooperative. Patient is in no acute distress.  Skin: Skin is warm and dry. No rash noted.   Cardiovascular: Normal heart rate noted  Respiratory: Normal respiratory effort, no problems with respiration noted  Abdomen: Soft, gravid, appropriate for gestational age. Pain/Pressure: Absent     Pelvic:  Cervical exam deferred        Extremities: Normal range of motion.  Edema: None  Mental Status: Normal mood and affect.  Normal behavior. Normal judgment and thought content.   Assessment   32 y.o. G1P0 at [redacted]w[redacted]d by  08/12/2022, by Ultrasound presenting for routine prenatal visit  BREECH!  Plan   first Problems (from 01/10/22 to present)     No problems associated with this episode.        Term labor symptoms and general obstetric precautions including but not limited to vaginal bleeding, contractions, leaking of fluid and fetal movement were reviewed in detail with the patient. Please refer to After Visit Summary for other counseling recommendations.  We discussed today's findings of breech presentation. Offered option of attempt at external version or proceeding with CS. Dr. Amalia Hailey was invited in to discuss this further. She desire version , so this has been set up for tomorrow on 1/25 at 11:00 at [facility]. Dr. Marcelline Mates has also been informed. I have also provided her education on "Spinning Babies" and breech tilt exercises to try today.  Return in about 1 week (around 08/03/2022) for return OB and, if baby is still breech , needs MD visit to plan for CS.Imagene Riches, CNM  07/27/2022 12:20 PM

## 2022-07-27 NOTE — Progress Notes (Signed)
ROB- no concerns 

## 2022-07-28 ENCOUNTER — Other Ambulatory Visit: Payer: Self-pay

## 2022-07-28 ENCOUNTER — Other Ambulatory Visit: Payer: Self-pay | Admitting: Obstetrics and Gynecology

## 2022-07-28 ENCOUNTER — Observation Stay
Admission: RE | Admit: 2022-07-28 | Discharge: 2022-07-28 | Disposition: A | Discharge status: Home / Self Care | Payer: Medicaid Other | Attending: Obstetrics and Gynecology | Admitting: Obstetrics and Gynecology

## 2022-07-28 ENCOUNTER — Encounter: Payer: Self-pay | Admitting: Obstetrics and Gynecology

## 2022-07-28 DIAGNOSIS — O99213 Obesity complicating pregnancy, third trimester: Secondary | ICD-10-CM | POA: Diagnosis not present

## 2022-07-28 DIAGNOSIS — Z01818 Encounter for other preprocedural examination: Secondary | ICD-10-CM

## 2022-07-28 DIAGNOSIS — Z3A37 37 weeks gestation of pregnancy: Secondary | ICD-10-CM | POA: Diagnosis not present

## 2022-07-28 DIAGNOSIS — O321XX0 Maternal care for breech presentation, not applicable or unspecified: Principal | ICD-10-CM | POA: Diagnosis present

## 2022-07-28 DIAGNOSIS — O329XX0 Maternal care for malpresentation of fetus, unspecified, not applicable or unspecified: Secondary | ICD-10-CM | POA: Diagnosis not present

## 2022-07-28 DIAGNOSIS — E669 Obesity, unspecified: Secondary | ICD-10-CM

## 2022-07-28 LAB — CBC
HCT: 30.8 % — ABNORMAL LOW (ref 36.0–46.0)
Hemoglobin: 9.5 g/dL — ABNORMAL LOW (ref 12.0–15.0)
MCH: 22.6 pg — ABNORMAL LOW (ref 26.0–34.0)
MCHC: 30.8 g/dL (ref 30.0–36.0)
MCV: 73.3 fL — ABNORMAL LOW (ref 80.0–100.0)
Platelets: 280 10*3/uL (ref 150–400)
RBC: 4.2 MIL/uL (ref 3.87–5.11)
RDW: 15.2 % (ref 11.5–15.5)
WBC: 11.8 10*3/uL — ABNORMAL HIGH (ref 4.0–10.5)
nRBC: 0.2 % (ref 0.0–0.2)

## 2022-07-28 LAB — TYPE AND SCREEN
ABO/RH(D): B POS
Antibody Screen: NEGATIVE

## 2022-07-28 LAB — ABO/RH: ABO/RH(D): B POS

## 2022-07-28 MED ORDER — TERBUTALINE SULFATE 1 MG/ML IJ SOLN
0.2500 mg | Freq: Once | INTRAMUSCULAR | Status: AC
  Administered 2022-07-28: 0.25 mg via SUBCUTANEOUS
  Filled 2022-07-28: qty 1

## 2022-07-28 MED ORDER — MINERAL OIL PO OIL
TOPICAL_OIL | Freq: Once | ORAL | Status: DC
  Filled 2022-07-28: qty 30

## 2022-07-28 MED ORDER — LACTATED RINGERS IV BOLUS
1000.0000 mL | Freq: Once | INTRAVENOUS | Status: AC
  Administered 2022-07-28: 1000 mL via INTRAVENOUS

## 2022-07-28 NOTE — Progress Notes (Signed)
Discharge instructions provided to patient. Patient verbalized understanding. Pt educated on signs and symptoms of labor, vaginal bleeding, LOF, and fetal movement. Red flag signs reviewed by RN. Patient discharged home with significant other in stable condition.  

## 2022-07-28 NOTE — Procedures (Signed)
EXTERNAL CEPHALIC VERSION Procedure Note   DELONA CLASBY female 32 y.o. 07/28/2022    Indications: The patient is a 32 y.o. G1P0 female at [redacted]w[redacted]d with fetal malpresentation (frank breech). The patient has been previously counseled on risks of the procedure including uterine contractions with progression to labor, fetal distress, rupture of membranes, failure of procedure requiring Cesarean delivery, vaginal bleeding, abruption, and fetal death. Consent form for procedure has been signed.   Pre-operative Diagnosis: A [redacted]w[redacted]d week intrauterine pregnancy in breech presentation, obesity (BMI 40).    Post-operative Diagnosis: A [redacted]w[redacted]d intrauterine pregnancy in vertex presentation, status post successful external cephalic version.   Surgeon: Rubie Maid, MD   Assistants: Lloyd Huger, CNM. An experienced assistant was required for fetal manipulation, and retraction.    Anesthesia: None   Procedure Details:  The patient was brought to Labor and Delivery where a reactive fetal heart tracing was obtained. The patient was not noted to have any uterine contractions.. She was given 1 dose of subcutaneous terbutaline  for uterine relaxation and a 1L IVF bolus. A bedside ultrasound was performed which revealed single intrauterine pregnancy and breech presentation. There was noted to be adequate fluid.  Copious mineral oil was used for abdominal lubrication. Using manual pressure, the breech was manipulated in a forward roll fashion and then in a backward roll fashion.  Each direction was attempted 3 times each. Fetal heart tones were checked intermittently during the procedure and were noted to be reassuring. Following unsuccessful external cephalic version, the patient was placed on continuous external fetal monitoring. She was noted to have a reassuring and reactive tracing for 1 hour following the procedure.  She did not have regular contractions and therefore she was felt to be stable for discharge to  home. She was given appropriate labor instructions. Advised on different position changes to help rotate fetus. WIll also schedule for C-section at 39 weeks if no spontaneous version occurs.  Surgery to be scheduled for 08/05/22.     Complications: None     Rubie Maid, MD [facility] at [facility]

## 2022-07-29 LAB — RPR: RPR Ser Ql: NONREACTIVE

## 2022-07-31 NOTE — Discharge Summary (Signed)
Obstetric Discharge Summary Reason for Admission: observation and external cephalic versionfor breech presentation  Prenatal Procedures: NST before and after attempted procedure Complications: none   Hemoglobin  Date Value Ref Range Status  07/28/2022 9.5 (L) 12.0 - 15.0 g/dL Final  05/13/2022 9.6 (L) 11.1 - 15.9 g/dL Final   HCT  Date Value Ref Range Status  07/28/2022 30.8 (L) 36.0 - 46.0 % Final   Hematocrit  Date Value Ref Range Status  05/13/2022 28.9 (L) 34.0 - 46.6 % Final    Physical Exam:  General: alert and no distress Uterine Fundus: gravid, breech presentation   Fetus A Non-Stress Test Interpretation for 07/31/22  Indication: pre-procedure  Fetal Heart Rate A Mode: External Baseline Rate (A): 125 bpm (fht) Variability: Moderate Accelerations: 15 x 15 Decelerations: None  Uterine Activity Mode: Toco Contraction Frequency (min): 1-8 (uterine irritability) Contraction Duration (sec): 40-80 Contraction Quality: Mild Resting Tone Palpated: Relaxed Resting Time: Adequate    Fetus A Non-Stress Test Interpretation for 07/31/22  Indication:  post-procedure  Fetal Heart Rate A Mode: External Baseline Rate (A): 145 bpm (fht) Variability: Moderate Accelerations: 15 x 15 Decelerations: None  Uterine Activity Mode: Toco Contraction Frequency (min): one noted Contraction Duration (sec): 60 Contraction Quality: Mild Resting Tone Palpated: Relaxed Resting Time: Adequate    Discharge Diagnoses:  Term pregnancy, breech presentation, failed ECV.   Discharge Information: Date: 07/31/2022 Activity: unrestricted Diet: routine Medications: None Condition: stable Instructions:  continue routine prenatal care. Scheduled for C-section on 2/2//22024.  Discharge to: home   Rubie Maid, MD [place] at [facility]  07/31/2022, 8:16 PM

## 2022-08-02 DIAGNOSIS — Z3A38 38 weeks gestation of pregnancy: Secondary | ICD-10-CM | POA: Insufficient documentation

## 2022-08-02 NOTE — Progress Notes (Signed)
    NURSE VISIT NOTE  Subjective:    Patient ID: Nancy Huffman, female    DOB: 03/07/1991, 32 y.o.   MRN: [id number]  HPI  Patient is a 32 y.o. G1P0 female who presents for fetal monitoring per order from Gigi Gin, CNM.   Objective:    BP 106/72   Pulse 87   Ht 5' 1.5" (1.562 m)   Wt 230 lb 8 oz (104.6 kg)   LMP 10/25/2021 (Exact Date)   BMI 42.85 kg/m  Estimated Date of Delivery: 08/12/22  Assessment:   1. Encounter for supervision of normal first pregnancy in third trimester   2. Obesity affecting pregnancy in third trimester, unspecified obesity type   3. [redacted] weeks gestation of pregnancy      Plan:   Results reviewed and discussed with patient by  Rubie Maid, MD.     Otila Kluver, LPN

## 2022-08-02 NOTE — Patient Instructions (Signed)

## 2022-08-03 ENCOUNTER — Other Ambulatory Visit: Payer: Self-pay

## 2022-08-03 ENCOUNTER — Encounter
Admission: RE | Admit: 2022-08-03 | Discharge: 2022-08-03 | Disposition: A | Discharge status: Home / Self Care | Payer: Medicaid Other | Source: Ambulatory Visit | Attending: Obstetrics and Gynecology | Admitting: Obstetrics and Gynecology

## 2022-08-03 ENCOUNTER — Ambulatory Visit: Payer: Medicaid Other

## 2022-08-03 ENCOUNTER — Ambulatory Visit (INDEPENDENT_AMBULATORY_CARE_PROVIDER_SITE_OTHER): Payer: Medicaid Other | Admitting: Obstetrics and Gynecology

## 2022-08-03 VITALS — BP 106/72 | HR 87 | Ht 61.5 in | Wt 230.5 lb

## 2022-08-03 VITALS — BP 106/72 | HR 87 | Wt 230.5 lb

## 2022-08-03 DIAGNOSIS — Z3A38 38 weeks gestation of pregnancy: Secondary | ICD-10-CM | POA: Diagnosis not present

## 2022-08-03 DIAGNOSIS — Z3403 Encounter for supervision of normal first pregnancy, third trimester: Secondary | ICD-10-CM

## 2022-08-03 DIAGNOSIS — O99213 Obesity complicating pregnancy, third trimester: Secondary | ICD-10-CM

## 2022-08-03 DIAGNOSIS — O321XX0 Maternal care for breech presentation, not applicable or unspecified: Secondary | ICD-10-CM

## 2022-08-03 DIAGNOSIS — E669 Obesity, unspecified: Secondary | ICD-10-CM | POA: Diagnosis not present

## 2022-08-03 HISTORY — DX: Anemia, unspecified: D64.9

## 2022-08-03 LAB — POCT URINALYSIS DIPSTICK OB
Appearance: NORMAL
Bilirubin, UA: NEGATIVE
Blood, UA: NEGATIVE
Glucose, UA: NEGATIVE
Ketones, UA: NEGATIVE
Leukocytes, UA: NEGATIVE
Nitrite, UA: NEGATIVE
Odor: NORMAL
POC,PROTEIN,UA: NEGATIVE
Spec Grav, UA: 1.01 (ref 1.010–1.025)
Urobilinogen, UA: 0.2 E.U./dL
pH, UA: 7 (ref 5.0–8.0)

## 2022-08-03 NOTE — Progress Notes (Signed)
ROB [redacted]w[redacted]d: NST. Patient reports good fetal movement. Denies vaginal bleeding, leaking of fluid, contractions. C-Section scheduled for 2/2 Breech presentation.

## 2022-08-03 NOTE — Patient Instructions (Addendum)
Your procedure is scheduled on:08/05/22 - Friday  Arrival Time: Please call Labor and Delivery the day before your scheduled C-Section to find out your arrival time. [phone].  Arrival: If your arrival time is prior to 6:00 am, please enter through the Emergency Room Entrance and you will be directed to Labor and Delivery. If your arrival time is 6:00 am or later, please enter the [facility] and follow the greeter's instructions.  REMEMBER: Instructions that are not followed completely may result in serious medical risk, up to and including death; or upon the discretion of your surgeon and anesthesiologist your surgery may need to be rescheduled.  Do not eat food or drink any liquids after midnight the night before surgery.  No gum chewing, lozengers or hard candies.  TAKE THESE MEDICATIONS THE MORNING OF SURGERY WITH A SIP OF WATER: NONE  One week prior to surgery: Stop Anti-inflammatories (NSAIDS) such as Advil, Aleve, Ibuprofen, Motrin, Naproxen, Naprosyn and Aspirin based products such as Excedrin, Goodys Powder, BC Powder.  Stop ANY OVER THE COUNTER supplements until after surgery.  You may however, continue to take Tylenol if needed for pain up until the day of surgery.  No Alcohol for 24 hours before or after surgery.  No Smoking including e-cigarettes for 24 hours prior to surgery.  No chewable tobacco products for at least 6 hours prior to surgery.  No nicotine patches on the day of surgery.  Do not use any "recreational" drugs for at least a week prior to your surgery.  Please be advised that the combination of cocaine and anesthesia may have negative outcomes, up to and including death. If you test positive for cocaine, your surgery will be cancelled.  On the morning of surgery brush your teeth with toothpaste and water, you may rinse your mouth with mouthwash if you wish. Do not swallow any toothpaste or mouthwash.  Use CHG wipes as directed on instruction  sheet.  Do not wear jewelry, make-up, hairpins, clips or nail polish.  Do not wear lotions, powders, or perfumes.   Do not shave body from the neck down 48 hours prior to surgery just in case you cut yourself which could leave a site for infection.  Also, freshly shaved skin may become irritated if using the CHG soap.  Contact lenses, hearing aids and dentures may not be worn into surgery.  Do not bring valuables to the hospital. [facility] is not responsible for any missing/lost belongings or valuables.   Notify your doctor if there is any change in your medical condition (cold, fever, infection).  Wear comfortable clothing (specific to your surgery type) to the hospital.  After surgery, you can help prevent lung complications by doing breathing exercises.  Take deep breaths and cough every 1-2 hours. Your doctor may order a device called an Incentive Spirometer to help you take deep breaths. When coughing or sneezing, hold a pillow firmly against your incision with both hands. This is called "splinting." Doing this helps protect your incision. It also decreases belly discomfort.  Please call the [facility] Dept. at [phone] if you have any questions about these instructions.  Surgery Visitation Policy:  Support Person  The designated support person is not considered a visitor.  Any inpatient support person will be given a band identifying them as the patient's chosen support person and may stay with the patient around the clock.  Visitor Passes   All visitors, including children, need an identification sticker when visiting. These stickers must  be worn where they can be seen.   Labor & Delivery  Laboring women of any age may have one designated support person and one other visitor aged 53 and older at a time, and a doula registered with [facility] for the labor and delivery phase of their stay. (Doulas not registered with [facility] are counted as visitors.)  The visitor may switch with other visitors. Visitation is allowed 24 hours per day.  No children under the age of 36. The designated support person or a visitor may stay overnight in the room.  Mother Baby Unit, OB Specialty and Gynecological Care  A designated support person and three other visitors of any age may visit. The three visitors may switch out. The designated support person or a visitor aged 36 or older may stay overnight in the room. During the postpartum period (up to 6 weeks), if the mother is the patient, she can have her newborn stay with her if there is another support person present who can be responsible for the baby.   MASKING: Due to an increase in RSV rates and hospitalizations, starting Wednesday, Nov. 15, in patient care areas in which we serve newborns, infants and children, masks will be required for teammates and visitors.  Children ages 44 and under may not visit. This policy affects the following departments only:  [facility] Postpartum area Mother Baby Unit Newborn nursery/Special care nursery  Other areas: Masks continue to be strongly recommended for [facility] teammates, visitors and patients in all other areas. Visitation is not restricted outside of the units listed above.

## 2022-08-03 NOTE — Progress Notes (Signed)
ROB: Patient is a 32 y.o. G1P0 at [redacted]w[redacted]d who presents for routine OB care. Scheduled for primary C/S in 2 days due to breech presentation. Reports that she has preadmit appt later this afternoon.  Attempted to answer any remaining questions regarding procedure.  NST performed today was reviewed and was found to be reactive.      Fetus A Non-Stress Test Interpretation for 08/03/22  Indication:  obesity in pregnancy (BMI 42)  Fetal Heart Rate A Mode: External Baseline Rate (A): 125 bpm Accelerations: 15 x 15 Decelerations: None  Uterine Activity Mode: Toco Contraction Frequency (min): None (Uterine irritability)  Interpretation (Fetal Testing) Nonstress Test Interpretation: Reactive Overall Impression: Reassuring for gestational age

## 2022-08-04 ENCOUNTER — Encounter
Admission: RE | Admit: 2022-08-04 | Discharge: 2022-08-04 | Disposition: A | Discharge status: Home / Self Care | Payer: Medicaid Other | Source: Ambulatory Visit | Attending: Obstetrics and Gynecology | Admitting: Obstetrics and Gynecology

## 2022-08-04 DIAGNOSIS — Z01818 Encounter for other preprocedural examination: Secondary | ICD-10-CM

## 2022-08-04 DIAGNOSIS — Z01812 Encounter for preprocedural laboratory examination: Secondary | ICD-10-CM | POA: Insufficient documentation

## 2022-08-04 LAB — CBC
HCT: 30.6 % — ABNORMAL LOW (ref 36.0–46.0)
Hemoglobin: 9.3 g/dL — ABNORMAL LOW (ref 12.0–15.0)
MCH: 22.3 pg — ABNORMAL LOW (ref 26.0–34.0)
MCHC: 30.4 g/dL (ref 30.0–36.0)
MCV: 73.4 fL — ABNORMAL LOW (ref 80.0–100.0)
Platelets: 257 10*3/uL (ref 150–400)
RBC: 4.17 MIL/uL (ref 3.87–5.11)
RDW: 15.3 % (ref 11.5–15.5)
WBC: 12 10*3/uL — ABNORMAL HIGH (ref 4.0–10.5)
nRBC: 0 % (ref 0.0–0.2)

## 2022-08-04 LAB — TYPE AND SCREEN
ABO/RH(D): B POS
Antibody Screen: NEGATIVE
Extend sample reason: UNDETERMINED

## 2022-08-05 ENCOUNTER — Encounter: Payer: Self-pay | Admitting: Obstetrics and Gynecology

## 2022-08-05 ENCOUNTER — Other Ambulatory Visit: Payer: Self-pay

## 2022-08-05 ENCOUNTER — Inpatient Hospital Stay: Payer: Medicaid Other | Admitting: Certified Registered Nurse Anesthetist

## 2022-08-05 ENCOUNTER — Encounter
Admission: RE | Disposition: A | Discharge status: Home / Self Care | Payer: Self-pay | Source: Ambulatory Visit | Attending: Obstetrics and Gynecology

## 2022-08-05 ENCOUNTER — Inpatient Hospital Stay
Admission: RE | Admit: 2022-08-05 | Discharge: 2022-08-07 | DRG: 788 | Disposition: A | Discharge status: Home / Self Care | Payer: Medicaid Other | Source: Ambulatory Visit | Attending: Obstetrics and Gynecology | Admitting: Obstetrics and Gynecology

## 2022-08-05 DIAGNOSIS — O321XX0 Maternal care for breech presentation, not applicable or unspecified: Secondary | ICD-10-CM | POA: Diagnosis present

## 2022-08-05 DIAGNOSIS — O99013 Anemia complicating pregnancy, third trimester: Secondary | ICD-10-CM | POA: Diagnosis not present

## 2022-08-05 DIAGNOSIS — Z3A39 39 weeks gestation of pregnancy: Secondary | ICD-10-CM | POA: Diagnosis not present

## 2022-08-05 DIAGNOSIS — O329XX0 Maternal care for malpresentation of fetus, unspecified, not applicable or unspecified: Secondary | ICD-10-CM | POA: Diagnosis present

## 2022-08-05 DIAGNOSIS — E669 Obesity, unspecified: Secondary | ICD-10-CM | POA: Diagnosis not present

## 2022-08-05 DIAGNOSIS — D509 Iron deficiency anemia, unspecified: Secondary | ICD-10-CM

## 2022-08-05 DIAGNOSIS — O09899 Supervision of other high risk pregnancies, unspecified trimester: Secondary | ICD-10-CM

## 2022-08-05 DIAGNOSIS — Z23 Encounter for immunization: Secondary | ICD-10-CM | POA: Diagnosis not present

## 2022-08-05 DIAGNOSIS — Z2839 Other underimmunization status: Secondary | ICD-10-CM

## 2022-08-05 DIAGNOSIS — O99214 Obesity complicating childbirth: Secondary | ICD-10-CM

## 2022-08-05 DIAGNOSIS — O99213 Obesity complicating pregnancy, third trimester: Secondary | ICD-10-CM | POA: Diagnosis present

## 2022-08-05 DIAGNOSIS — O9902 Anemia complicating childbirth: Secondary | ICD-10-CM | POA: Diagnosis present

## 2022-08-05 LAB — RPR: RPR Ser Ql: NONREACTIVE

## 2022-08-05 SURGERY — Surgical Case
Anesthesia: Spinal

## 2022-08-05 MED ORDER — DIPHENHYDRAMINE HCL 25 MG PO CAPS: 25.0000 mg | ORAL_CAPSULE | Freq: Four times a day (QID) | ORAL | Status: DC | PRN

## 2022-08-05 MED ORDER — EPHEDRINE 5 MG/ML INJ
INTRAVENOUS | Status: AC
  Filled 2022-08-05: qty 5

## 2022-08-05 MED ORDER — LACTATED RINGERS IV SOLN: INTRAVENOUS | Status: DC

## 2022-08-05 MED ORDER — DIPHENHYDRAMINE HCL 25 MG PO CAPS: 25.0000 mg | ORAL_CAPSULE | ORAL | Status: DC | PRN

## 2022-08-05 MED ORDER — EPHEDRINE SULFATE-NACL 50-0.9 MG/10ML-% IV SOSY
PREFILLED_SYRINGE | INTRAVENOUS | Status: DC | PRN
  Administered 2022-08-05: 5 mg via INTRAVENOUS

## 2022-08-05 MED ORDER — ACETAMINOPHEN 500 MG PO TABS
1000.0000 mg | ORAL_TABLET | Freq: Four times a day (QID) | ORAL | Status: DC
  Administered 2022-08-05: 1000 mg via ORAL
  Filled 2022-08-05: qty 2

## 2022-08-05 MED ORDER — FERROUS SULFATE 325 (65 FE) MG PO TABS: 325.0000 mg | ORAL_TABLET | ORAL | Status: DC

## 2022-08-05 MED ORDER — FENTANYL CITRATE (PF) 100 MCG/2ML IJ SOLN
INTRAMUSCULAR | Status: DC | PRN
  Administered 2022-08-05: 15 ug via INTRATHECAL

## 2022-08-05 MED ORDER — NALOXONE HCL 4 MG/10ML IJ SOLN
1.0000 ug/kg/h | INTRAVENOUS | Status: DC | PRN
  Filled 2022-08-05: qty 5

## 2022-08-05 MED ORDER — LACTATED RINGERS IV SOLN: Freq: Once | INTRAVENOUS | Status: AC

## 2022-08-05 MED ORDER — KETOROLAC TROMETHAMINE 30 MG/ML IJ SOLN: 30.0000 mg | Freq: Four times a day (QID) | INTRAMUSCULAR | Status: DC

## 2022-08-05 MED ORDER — ORAL CARE MOUTH RINSE: 15.0000 mL | Freq: Once | OROMUCOSAL | Status: AC

## 2022-08-05 MED ORDER — OXYTOCIN-SODIUM CHLORIDE 30-0.9 UT/500ML-% IV SOLN
INTRAVENOUS | Status: AC
  Filled 2022-08-05: qty 500

## 2022-08-05 MED ORDER — KETOROLAC TROMETHAMINE 30 MG/ML IJ SOLN
30.0000 mg | Freq: Four times a day (QID) | INTRAMUSCULAR | Status: DC
  Administered 2022-08-05: 30 mg via INTRAVENOUS
  Filled 2022-08-05 (×3): qty 1

## 2022-08-05 MED ORDER — MORPHINE SULFATE (PF) 0.5 MG/ML IJ SOLN
INTRAMUSCULAR | Status: DC | PRN
  Administered 2022-08-05: .2 mg via INTRATHECAL

## 2022-08-05 MED ORDER — LIDOCAINE 5 % EX PTCH
MEDICATED_PATCH | CUTANEOUS | Status: AC
  Filled 2022-08-05: qty 1

## 2022-08-05 MED ORDER — DIPHENHYDRAMINE HCL 50 MG/ML IJ SOLN: 12.5000 mg | INTRAMUSCULAR | Status: DC | PRN

## 2022-08-05 MED ORDER — SOD CITRATE-CITRIC ACID 500-334 MG/5ML PO SOLN
30.0000 mL | ORAL | Status: AC
  Administered 2022-08-05: 30 mL via ORAL

## 2022-08-05 MED ORDER — FAMOTIDINE 20 MG PO TABS
20.0000 mg | ORAL_TABLET | Freq: Once | ORAL | Status: AC
  Administered 2022-08-05: 20 mg via ORAL
  Filled 2022-08-05: qty 1

## 2022-08-05 MED ORDER — ZOLPIDEM TARTRATE 5 MG PO TABS: 5.0000 mg | ORAL_TABLET | Freq: Every evening | ORAL | Status: DC | PRN

## 2022-08-05 MED ORDER — ONDANSETRON HCL 4 MG/2ML IJ SOLN
INTRAMUSCULAR | Status: AC
  Filled 2022-08-05: qty 2

## 2022-08-05 MED ORDER — SODIUM CHLORIDE 0.9% FLUSH: 3.0000 mL | INTRAVENOUS | Status: DC | PRN

## 2022-08-05 MED ORDER — KETOROLAC TROMETHAMINE 30 MG/ML IJ SOLN
30.0000 mg | Freq: Four times a day (QID) | INTRAMUSCULAR | Status: AC
  Administered 2022-08-05 – 2022-08-06 (×4): 30 mg via INTRAVENOUS
  Filled 2022-08-05 (×2): qty 1

## 2022-08-05 MED ORDER — TRAMADOL HCL 50 MG PO TABS: 50.0000 mg | ORAL_TABLET | Freq: Four times a day (QID) | ORAL | Status: DC | PRN

## 2022-08-05 MED ORDER — LIDOCAINE 5 % EX PTCH
MEDICATED_PATCH | CUTANEOUS | Status: DC | PRN
  Administered 2022-08-05: 1 via TRANSDERMAL

## 2022-08-05 MED ORDER — SOD CITRATE-CITRIC ACID 500-334 MG/5ML PO SOLN
ORAL | Status: AC
  Filled 2022-08-05: qty 15

## 2022-08-05 MED ORDER — SIMETHICONE 80 MG PO CHEW: 80.0000 mg | CHEWABLE_TABLET | ORAL | Status: DC | PRN

## 2022-08-05 MED ORDER — ONDANSETRON HCL 4 MG/2ML IJ SOLN: 4.0000 mg | Freq: Three times a day (TID) | INTRAMUSCULAR | Status: DC | PRN

## 2022-08-05 MED ORDER — FENTANYL CITRATE (PF) 100 MCG/2ML IJ SOLN
INTRAMUSCULAR | Status: AC
  Filled 2022-08-05: qty 2

## 2022-08-05 MED ORDER — SENNOSIDES-DOCUSATE SODIUM 8.6-50 MG PO TABS
2.0000 | ORAL_TABLET | Freq: Every day | ORAL | Status: DC
  Administered 2022-08-06 – 2022-08-07 (×2): 2 via ORAL
  Filled 2022-08-05 (×2): qty 2

## 2022-08-05 MED ORDER — SODIUM CHLORIDE 0.9 % IV SOLN
2.0000 g | INTRAVENOUS | Status: AC
  Administered 2022-08-05: 2 g via INTRAVENOUS
  Filled 2022-08-05: qty 2

## 2022-08-05 MED ORDER — CHLORHEXIDINE GLUCONATE 0.12 % MT SOLN
15.0000 mL | Freq: Once | OROMUCOSAL | Status: AC
  Administered 2022-08-05: 15 mL via OROMUCOSAL
  Filled 2022-08-05: qty 15

## 2022-08-05 MED ORDER — ONDANSETRON HCL 4 MG/2ML IJ SOLN
INTRAMUSCULAR | Status: DC | PRN
  Administered 2022-08-05: 4 mg via INTRAVENOUS

## 2022-08-05 MED ORDER — IBUPROFEN 600 MG PO TABS
600.0000 mg | ORAL_TABLET | Freq: Four times a day (QID) | ORAL | Status: DC
  Administered 2022-08-06 – 2022-08-07 (×4): 600 mg via ORAL
  Filled 2022-08-05 (×4): qty 1

## 2022-08-05 MED ORDER — MEPERIDINE HCL 25 MG/ML IJ SOLN: 6.2500 mg | INTRAMUSCULAR | Status: DC | PRN

## 2022-08-05 MED ORDER — ACETAMINOPHEN 500 MG PO TABS
1000.0000 mg | ORAL_TABLET | Freq: Four times a day (QID) | ORAL | Status: DC
  Administered 2022-08-05 – 2022-08-07 (×8): 1000 mg via ORAL
  Filled 2022-08-05 (×8): qty 2

## 2022-08-05 MED ORDER — COCONUT OIL OIL
1.0000 | TOPICAL_OIL | Status: DC | PRN
  Administered 2022-08-07: 1 via TOPICAL
  Filled 2022-08-05 (×2): qty 7.5

## 2022-08-05 MED ORDER — WITCH HAZEL-GLYCERIN EX PADS: 1.0000 | MEDICATED_PAD | CUTANEOUS | Status: DC | PRN

## 2022-08-05 MED ORDER — PHENYLEPHRINE HCL-NACL 20-0.9 MG/250ML-% IV SOLN
INTRAVENOUS | Status: DC | PRN
  Administered 2022-08-05: 50 ug/min via INTRAVENOUS

## 2022-08-05 MED ORDER — SCOPOLAMINE 1 MG/3DAYS TD PT72: 1.0000 | MEDICATED_PATCH | Freq: Once | TRANSDERMAL | Status: DC

## 2022-08-05 MED ORDER — OXYCODONE HCL 5 MG PO TABS: 5.0000 mg | ORAL_TABLET | Freq: Four times a day (QID) | ORAL | Status: DC | PRN

## 2022-08-05 MED ORDER — OXYTOCIN-SODIUM CHLORIDE 30-0.9 UT/500ML-% IV SOLN
INTRAVENOUS | Status: DC | PRN
  Administered 2022-08-05: 300 mL via INTRAVENOUS

## 2022-08-05 MED ORDER — GABAPENTIN 300 MG PO CAPS
300.0000 mg | ORAL_CAPSULE | Freq: Two times a day (BID) | ORAL | Status: DC
  Administered 2022-08-05 – 2022-08-07 (×5): 300 mg via ORAL
  Filled 2022-08-05 (×5): qty 1

## 2022-08-05 MED ORDER — BUPIVACAINE IN DEXTROSE 0.75-8.25 % IT SOLN
INTRATHECAL | Status: DC | PRN
  Administered 2022-08-05: 1.5 mL via INTRATHECAL

## 2022-08-05 MED ORDER — DIBUCAINE (PERIANAL) 1 % EX OINT: 1.0000 | TOPICAL_OINTMENT | CUTANEOUS | Status: DC | PRN

## 2022-08-05 MED ORDER — MENTHOL 3 MG MT LOZG: 1.0000 | LOZENGE | OROMUCOSAL | Status: DC | PRN

## 2022-08-05 MED ORDER — HYDROMORPHONE HCL 1 MG/ML IJ SOLN: 1.0000 mg | INTRAMUSCULAR | Status: DC | PRN

## 2022-08-05 MED ORDER — PHENYLEPHRINE HCL-NACL 20-0.9 MG/250ML-% IV SOLN
INTRAVENOUS | Status: AC
  Filled 2022-08-05: qty 250

## 2022-08-05 MED ORDER — MAGNESIUM HYDROXIDE 400 MG/5ML PO SUSP: 30.0000 mL | ORAL | Status: DC | PRN

## 2022-08-05 MED ORDER — PRENATAL MULTIVITAMIN CH
1.0000 | ORAL_TABLET | Freq: Every day | ORAL | Status: DC
  Administered 2022-08-05 – 2022-08-07 (×3): 1 via ORAL
  Filled 2022-08-05 (×3): qty 1

## 2022-08-05 MED ORDER — OXYTOCIN-SODIUM CHLORIDE 30-0.9 UT/500ML-% IV SOLN: 2.5000 [IU]/h | INTRAVENOUS | Status: DC

## 2022-08-05 MED ORDER — LIDOCAINE 5 % EX PTCH
1.0000 | MEDICATED_PATCH | CUTANEOUS | Status: DC
  Administered 2022-08-06: 1 via TRANSDERMAL
  Filled 2022-08-05 (×2): qty 1

## 2022-08-05 MED ORDER — MORPHINE SULFATE (PF) 0.5 MG/ML IJ SOLN
INTRAMUSCULAR | Status: AC
  Filled 2022-08-05: qty 10

## 2022-08-05 MED ORDER — GABAPENTIN 300 MG PO CAPS
300.0000 mg | ORAL_CAPSULE | ORAL | Status: AC
  Administered 2022-08-05: 300 mg via ORAL
  Filled 2022-08-05: qty 1

## 2022-08-05 MED ORDER — NALOXONE HCL 0.4 MG/ML IJ SOLN: 0.4000 mg | INTRAMUSCULAR | Status: DC | PRN

## 2022-08-05 MED ORDER — OXYCODONE HCL 5 MG PO TABS: 5.0000 mg | ORAL_TABLET | ORAL | Status: DC | PRN

## 2022-08-05 MED ORDER — POVIDONE-IODINE 10 % EX SWAB: 2.0000 | Freq: Once | CUTANEOUS | Status: DC

## 2022-08-05 MED ORDER — ACETAMINOPHEN 500 MG PO TABS
1000.0000 mg | ORAL_TABLET | ORAL | Status: AC
  Administered 2022-08-05: 1000 mg via ORAL
  Filled 2022-08-05: qty 2

## 2022-08-05 MED ORDER — FENTANYL CITRATE (PF) 100 MCG/2ML IJ SOLN: INTRAMUSCULAR | Status: DC | PRN

## 2022-08-05 MED ORDER — VARICELLA VIRUS VACCINE LIVE 1350 PFU/0.5ML IJ SUSR
0.5000 mL | INTRAMUSCULAR | Status: AC | PRN
  Administered 2022-08-07: 0.5 mL via SUBCUTANEOUS
  Filled 2022-08-05: qty 0.5

## 2022-08-05 SURGICAL SUPPLY — 28 items
BAG COUNTER SPONGE SURGICOUNT (BAG) ×1 IMPLANT
CHLORAPREP W/TINT 26 (MISCELLANEOUS) ×2 IMPLANT
DERMABOND ADVANCED .7 DNX12 (GAUZE/BANDAGES/DRESSINGS) IMPLANT
DRSG TELFA 3X8 NADH STRL (GAUZE/BANDAGES/DRESSINGS) ×1 IMPLANT
ELECT REM PT RETURN 9FT ADLT (ELECTROSURGICAL) ×1
ELECTRODE REM PT RTRN 9FT ADLT (ELECTROSURGICAL) ×1 IMPLANT
EXTRT SYSTEM ALEXIS 17CM (MISCELLANEOUS)
GAUZE SPONGE 4X4 12PLY STRL (GAUZE/BANDAGES/DRESSINGS) ×1 IMPLANT
GLOVE BIO SURGEON STRL SZ 6.5 (GLOVE) ×1 IMPLANT
GLOVE SURG UNDER LTX SZ7 (GLOVE) ×1 IMPLANT
GOWN STRL REUS W/ TWL LRG LVL3 (GOWN DISPOSABLE) ×2 IMPLANT
GOWN STRL REUS W/TWL LRG LVL3 (GOWN DISPOSABLE) ×2
KIT TURNOVER KIT A (KITS) ×1 IMPLANT
MANIFOLD NEPTUNE II (INSTRUMENTS) ×1 IMPLANT
MAT PREVALON FULL STRYKER (MISCELLANEOUS) ×1 IMPLANT
NS IRRIG 1000ML POUR BTL (IV SOLUTION) ×1 IMPLANT
PACK C SECTION AR (MISCELLANEOUS) ×1 IMPLANT
PAD OB MATERNITY 4.3X12.25 (PERSONAL CARE ITEMS) ×1 IMPLANT
PAD PREP 24X41 OB/GYN DISP (PERSONAL CARE ITEMS) ×1 IMPLANT
SCRUB CHG 4% DYNA-HEX 4OZ (MISCELLANEOUS) ×1 IMPLANT
SUT MNCRL AB 4-0 PS2 18 (SUTURE) ×1 IMPLANT
SUT PLAIN 2 0 XLH (SUTURE) IMPLANT
SUT VIC AB 0 CT1 36 (SUTURE) ×4 IMPLANT
SUT VIC AB 3-0 SH 27 (SUTURE) ×1
SUT VIC AB 3-0 SH 27X BRD (SUTURE) ×1 IMPLANT
SYSTEM CONTND EXTRCTN KII BLLN (MISCELLANEOUS) IMPLANT
TRAP FLUID SMOKE EVACUATOR (MISCELLANEOUS) ×1 IMPLANT
WATER STERILE IRR 500ML POUR (IV SOLUTION) ×1 IMPLANT

## 2022-08-05 NOTE — Lactation Note (Signed)
This note was copied from a baby's chart. Lactation Consultation Note  Patient Name: Nancy Huffman Today's Date: 08/05/2022  Reason for consult:L&D Initial assessment; Primapara; Term; Breastfeeding assistance; RN request  Age:32 hours  Maternal Data  This is mom's 1st baby, Primary C/S for malpresentation. Mom with history of anemia and obesity. Mom's goal is to breastfeed.  On initial consult today assisted mom with baby's first feeding.  Feeding  Mother's Feeding Choice on Admission: Breastmilk Assisted mother with first feed. Baby latched well at both breasts with multiple audible swallows.Provided mom tips and strategies to maximize position and latch techniques.  LATCH Score: 9    2 = Grasps breast easily, tongue down, lips flanged, rhythmical sucking.   2 = Spontaneous and intermittent   2 = Everted at rest and after stimulation   2 = Soft / non-tender  1 = Assistance needed to correctly position infant at breast and maintain latch.   Interventions  Breast feeding basics reviewed; Assisted with latch; Education; Breast compression; Adjust position; Support pillows; Position options; Breast massage; Hand express   Mom has personal pump.    Consult Status  Follow-up from L&D  Update provided to care nurse.  [name] 08/05/2022, 2:22 PM

## 2022-08-05 NOTE — Anesthesia Procedure Notes (Signed)
Spinal  Patient location during procedure: OR Start time: 08/05/2022 7:53 AM End time: 08/05/2022 7:57 AM Reason for block: surgical anesthesia Staffing Performed: resident/CRNA  Anesthesiologist: Ilene Qua, MD Resident/CRNA: Aline Brochure, CRNA Performed by: Aline Brochure, CRNA Authorized by: Ilene Qua, MD   Preanesthetic Checklist Completed: patient identified, IV checked, site marked, risks and benefits discussed, surgical consent, monitors and equipment checked, pre-op evaluation and timeout performed Spinal Block Patient position: sitting Prep: ChloraPrep Patient monitoring: heart rate, continuous pulse ox, blood pressure and cardiac monitor Approach: midline Location: L3-4 Injection technique: single-shot Needle Needle type: Introducer and Pencan  Needle gauge: 24 G Needle length: 9 cm Assessment Sensory level: T10 Events: CSF return Additional Notes Sterile aseptic technique used throughout the procedure.  Negative paresthesia. Negative blood return. Positive free-flowing CSF. Expiration date of kit checked and confirmed. Patient tolerated procedure well, without complications.

## 2022-08-05 NOTE — H&P (Addendum)
Obstetric Preoperative History and Physical  Nancy Huffman is a 32 y.o. G1P0 with IUP at [redacted]w[redacted]d presenting for presenting for scheduled primary cesarean section for breech presentation at term.  S/p failed ECV 1 week prior.  No acute concerns.   Prenatal Course Source of Care: [facility] with onset of care at 11 weeks  Pregnancy complications or risks: Patient Active Problem List   Diagnosis Date Noted   [redacted] weeks gestation of pregnancy 08/02/2022   Breech presentation 07/28/2022   Obesity affecting pregnancy in third trimester 07/20/2022   Anemia in pregnancy, third trimester 05/17/2022   Maternal varicella, non-immune 05/13/2022   Encounter for supervision of normal first pregnancy in third trimester 01/10/2022   Seasonal allergies 08/20/2015   Heartburn 08/20/2015   High cholesterol 08/20/2015   Humeral shaft fracture 09/05/2013   She plans to breastfeed She desires condoms, withdrawal method  for postpartum contraception.   Prenatal labs and studies: ABO, Rh: --/--/B POS (02/01 1218) Antibody: NEG (02/01 1218) Rubella: 1.99 (07/11 1006) RPR: NON REACTIVE (01/25 1122)  HBsAg: Negative (07/11 1006)  HIV: Non Reactive (11/10 1032)  JQZ:ESPQZRAQ/-- (01/17 1510) 1 hr Glucola  normal (78) Genetic screening normal (AFP only) Anatomy US normal   Past Medical History:  Diagnosis Date   Anemia    Medical history non-contributory     Past Surgical History:  Procedure Laterality Date   CHOLECYSTECTOMY     ORIF HUMERUS FRACTURE Right 09/05/2013   Procedure: OPEN REDUCTION INTERNAL FIXATION (ORIF) HUMERAL SHAFT FRACTURE;  Surgeon: Meredith Pel, MD;  Location: [facility];  Service: Orthopedics;  Laterality: Right;  Right Humeral Shaft Fracture Fixation    OB History  Gravida Para Term Preterm AB Living  1            SAB IAB Ectopic Multiple Live Births               # Outcome Date GA Lbr Len/2nd Weight Sex Delivery Anes PTL Lv  1 Current             Social  History   Socioeconomic History   Marital status: Married    Spouse name: Hart Carwin   Number of children: 0   Years of education: 13   Highest education level: Not on file  Occupational History   Occupation: Administrator, sports  Tobacco Use   Smoking status: Never   Smokeless tobacco: Never  Vaping Use   Vaping Use: Never used  Substance and Sexual Activity   Alcohol use: Not Currently    Comment: occasionally   Drug use: No   Sexual activity: Yes    Birth control/protection: None  Other Topics Concern   Not on file  Social History Narrative   Not on file   Social Determinants of Health   Financial Resource Strain: Low Risk  (01/10/2022)   Overall Financial Resource Strain (CARDIA)    Difficulty of Paying Living Expenses: Not hard at all  Food Insecurity: No Food Insecurity (08/05/2022)   Hunger Vital Sign    Worried About Running Out of Food in the Last Year: Never true    Ran Out of Food in the Last Year: Never true  Transportation Needs: No Transportation Needs (08/05/2022)   PRAPARE - Hydrologist (Medical): No    Lack of Transportation (Non-Medical): No  Physical Activity: Insufficiently Active (01/10/2022)   Exercise Vital Sign    Days of Exercise per Week: 2 days    Minutes  of Exercise per Session: 30 min  Stress: No Stress Concern Present (01/10/2022)   [place]    Feeling of Stress : Not at all  Social Connections: Moderately Isolated (01/10/2022)   Social Connection and Isolation Panel [NHANES]    Frequency of Communication with Friends and Family: Three times a week    Frequency of Social Gatherings with Friends and Family: Twice a week    Attends Religious Services: Never    Marine scientist or Organizations: No    Attends Music therapist: Never    Marital Status: Married    Family History  Problem Relation Age of Onset   Cancer Mother     Cancer Father 42       colon   Diabetes Father    Polycystic ovary syndrome Sister    Diabetes Maternal Grandmother    Stroke Maternal Grandmother    Heart disease Maternal Grandfather    Diabetes Paternal Grandmother    Heart disease Paternal Grandfather     Medications Prior to Admission  Medication Sig Dispense Refill Last Dose   Prenatal Vit-Fe Fumarate-FA (PRENATAL VITAMINS) 28-0.8 MG TABS Take 1 tablet by mouth daily.       Allergies  Allergen Reactions   Other     seasonal    Review of Systems: Negative except for what is mentioned in HPI.  Physical Exam: BP 133/77 (BP Location: Left Arm)   Pulse 78   Temp (!) 97.5 F (36.4 C) (Oral)   Resp 15   Ht 5' 1.5" (1.562 m)   Wt 101.2 kg   LMP 10/25/2021 (Exact Date)   BMI 41.45 kg/m  FHR by Doppler: 135 bpm GENERAL: Well-developed, well-nourished female in no acute distress.  LUNGS: Clear to auscultation bilaterally.  HEART: Regular rate and rhythm. ABDOMEN: Soft, nontender, nondistended, gravid.  PELVIC: Deferred EXTREMITIES: Nontender, no edema, 2+ distal pulses.   Bedside sono: frank breech with head to maternal right.   Pertinent Labs/Studies:   Results for orders placed or performed during the hospital encounter of 08/04/22 (from the past 72 hour(s))  CBC     Status: Abnormal   Collection Time: 08/04/22 12:18 PM  Result Value Ref Range   WBC 12.0 (H) 4.0 - 10.5 K/uL   RBC 4.17 3.87 - 5.11 MIL/uL   Hemoglobin 9.3 (L) 12.0 - 15.0 g/dL   HCT 30.6 (L) 36.0 - 46.0 %   MCV 73.4 (L) 80.0 - 100.0 fL   MCH 22.3 (L) 26.0 - 34.0 pg   MCHC 30.4 30.0 - 36.0 g/dL   RDW 15.3 11.5 - 15.5 %   Platelets 257 150 - 400 K/uL   nRBC 0.0 0.0 - 0.2 %    Comment: Performed at [facility], [address] [place], [place] [address]  Type and screen     Status: None   Collection Time: 08/04/22 12:18 PM  Result Value Ref Range   ABO/RH(D) B POS    Antibody Screen NEG    Sample Expiration 08/07/2022,2359     Extend sample reason      PREGNANT WITHIN 3 MONTHS, UNABLE TO EXTEND Performed at [facility], [address], [place], [place] [address]     Assessment and Plan :PUNAM BROUSSARD is a 32 y.o. G1P0 at [redacted]w[redacted]d being admitted  for scheduled cesarean section delivery . The patient is understanding of the planned procedure and is aware of and accepting of all  surgical risks, including but not limited to: bleeding which may require transfusion or reoperation; infection which may require antibiotics; injury to bowel, bladder, ureters or other surrounding organs which may require repair; injury to the fetus; need for additional procedures including hysterectomy in the event of life-threatening complications; placental abnormalities wth subsequent pregnancies; incisional problems; blood clot disorders which may require blood thinners;, and other postoperative/anesthesia complications. The patient is in agreement with the proposed plan, and gives informed written consent for the procedure. All questions have been answered.   Rubie Maid, MD [facility] at [facility]

## 2022-08-05 NOTE — Anesthesia Preprocedure Evaluation (Signed)
Anesthesia Evaluation  Patient identified by MRN, date of birth, ID band Patient awake    Reviewed: Allergy & Precautions, NPO status , Patient's Chart, lab work & pertinent test results  History of Anesthesia Complications Negative for: history of anesthetic complications  Airway Mallampati: III  TM Distance: >3 FB Neck ROM: full    Dental no notable dental hx.    Pulmonary neg pulmonary ROS   Pulmonary exam normal        Cardiovascular Exercise Tolerance: Good negative cardio ROS Normal cardiovascular exam     Neuro/Psych    GI/Hepatic negative GI ROS,,,  Endo/Other    Morbid obesity  Renal/GU   negative genitourinary   Musculoskeletal   Abdominal   Peds  Hematology  (+) Blood dyscrasia, anemia   Anesthesia Other Findings Past Medical History: No date: Anemia No date: Medical history non-contributory  Past Surgical History: No date: CHOLECYSTECTOMY 09/05/2013: ORIF HUMERUS FRACTURE; Right     Comment:  Procedure: OPEN REDUCTION INTERNAL FIXATION (ORIF)               HUMERAL SHAFT FRACTURE;  Surgeon: Meredith Pel, MD;              Location: [facility];  Service: Orthopedics;  Laterality:               Right;  Right Humeral Shaft Fracture Fixation  BMI    Body Mass Index: 41.45 kg/m      Reproductive/Obstetrics (+) Pregnancy                             Anesthesia Physical Anesthesia Plan  ASA: 3  Anesthesia Plan: Spinal   Post-op Pain Management: Minimal or no pain anticipated   Induction:   PONV Risk Score and Plan:   Airway Management Planned: Natural Airway and Nasal Cannula  Additional Equipment:   Intra-op Plan:   Post-operative Plan:   Informed Consent: I have reviewed the patients History and Physical, chart, labs and discussed the procedure including the risks, benefits and alternatives for the proposed anesthesia with the patient or authorized  representative who has indicated his/her understanding and acceptance.     Dental Advisory Given  Plan Discussed with: Anesthesiologist  Anesthesia Plan Comments: (Patient reports no bleeding problems and no anticoagulant use.  Plan for spinal with backup GA  Patient consented for risks of anesthesia including but not limited to:  - adverse reactions to medications - damage to eyes, teeth, lips or other oral mucosa - nerve damage due to positioning  - risk of bleeding, infection and or nerve damage from spinal that could lead to paralysis - risk of headache or failed spinal - damage to teeth, lips or other oral mucosa - sore throat or hoarseness - damage to heart, brain, nerves, lungs, other parts of body or loss of life  Patient voiced understanding.)        Anesthesia Quick Evaluation

## 2022-08-05 NOTE — Transfer of Care (Signed)
Immediate Anesthesia Transfer of Care Note  Patient: Nancy Huffman  Procedure(s) Performed: PRIMARY CESAREAN SECTION  Patient Location:  LDR6  Anesthesia Type:Spinal  Level of Consciousness: awake, alert , and oriented  Airway & Oxygen Therapy: Patient Spontanous Breathing  Post-op Assessment: Report given to RN and Post -op Vital signs reviewed and stable  Post vital signs: Reviewed and stable  Last Vitals:  Vitals Value Taken Time  BP 111/60   Temp    Pulse 67   Resp 11   SpO2 97     Last Pain:  Vitals:   08/05/22 0701  TempSrc: Oral  PainSc: 0-No pain         Complications: No notable events documented.

## 2022-08-05 NOTE — Op Note (Signed)
Cesarean Section Procedure Note  Indications: malpresentation: frank breech presentation, s/p failed external cephalic version 1 week prior.   Pre-operative Diagnosis: 39 week 0 day pregnancy, frank breech presentation s/p failed external cephalic version, obesity (BMI 41), iron deficiency anemia of pregnancy.  Post-operative Diagnosis: Same  Surgeon: Rubie Maid, MD  Assistants:  Jeannie Fend, MD  Procedure: Primary low transverse Cesarean Section  Anesthesia: Spinal anesthesia  Findings: Female infant, cephalic presentation, 5852 grams, with Apgar scores of 8 at one minute and 9 at five minutes. Intact placenta with 3 vessel cord.  Clear amniotic fluid at amniotomy The uterine outline, tubes and ovaries appeared normal.   Procedure Details: The patient was seen in the Holding Room. The risks, benefits, complications, treatment options, and expected outcomes were discussed with the patient.  The patient concurred with the proposed plan, giving informed consent.  The site of surgery properly noted/marked. The patient was taken to the Operating Room, identified as [place] and the procedure verified as C-Section Delivery. A Time Out was held and the above information confirmed.  After induction of anesthesia, the patient was draped and prepped in the usual sterile manner. Anesthesia was tested and noted to be adequate. A Pfannenstiel incision was made and carried down through the subcutaneous tissue to the fascia. Fascial incision was made and extended transversely. The fascia was separated from the underlying rectus tissue superiorly and inferiorly. The peritoneum was identified and entered. Peritoneal incision was extended longitudinally. The surgical assist was able to provide retraction to allow for clear visualization of surgical site. An Alexis retractor was placed in the abdomen for additional retraction. The utero-vesical peritoneal reflection was incised transversely and the  bladder flap was bluntly freed from the lower uterine segment. A low transverse uterine incision was made. Delivered from breech (frank) presentation was a 3360 gram Female with Apgar scores of 8 at one minute and 9 at five minutes.  The assistant was able to apply adequate fundal pressure to allow for successful delivery of the fetus. The umbilical cord was clamped and cut, no cord blood was obtained for evaluation. Delayed cord clamping was observed. The placenta was removed intact and appeared normal. The uterus was exteriorized and cleared of all clots and debris. The uterine outline, tubes and ovaries appeared normal.  The uterine incision was closed with running locked sutures of 0-Vicryl.  A second suture of 0-Vicryl was used in an imbricating layer.  Hemostasis was observed. The uterus was then returned to the abdomen. The pericolic gutters were cleared of all clots and debris. The fascia was then reapproximated with a running suture of 0-Vicryl. The subcutaneous fat layer was reapproximated with 2-0 Vicryl. The skin was reapproximated with 4-0 Monocryl. The incision was covered with Dermabond.  Lidoderm patch was placed over the incision.    Instrument, sponge, and needle counts were correct prior the abdominal closure and at the conclusion of the case.    An experienced assistant was required given the standard of surgical care given the complexity of the case.  This assistant was needed for exposure, dissection, suctioning, retraction, instrument exchange, and for overall help during the procedure.  Estimated Blood Loss:  440 ml      Drains: foley catheter to gravity drainage, 100 ml of clear urine at end of the procedure         Total IV Fluids:  100 ml  Specimens: None         Implants: None  Complications:  None; patient tolerated the procedure well.         Disposition: PACU - hemodynamically stable.         Condition: stable   Rubie Maid, MD [facility] at  [facility]

## 2022-08-06 LAB — CBC
HCT: 26.2 % — ABNORMAL LOW (ref 36.0–46.0)
Hemoglobin: 7.9 g/dL — ABNORMAL LOW (ref 12.0–15.0)
MCH: 22.4 pg — ABNORMAL LOW (ref 26.0–34.0)
MCHC: 30.2 g/dL (ref 30.0–36.0)
MCV: 74.2 fL — ABNORMAL LOW (ref 80.0–100.0)
Platelets: 209 10*3/uL (ref 150–400)
RBC: 3.53 MIL/uL — ABNORMAL LOW (ref 3.87–5.11)
RDW: 15.6 % — ABNORMAL HIGH (ref 11.5–15.5)
WBC: 10.9 10*3/uL — ABNORMAL HIGH (ref 4.0–10.5)
nRBC: 0.3 % — ABNORMAL HIGH (ref 0.0–0.2)

## 2022-08-06 MED ORDER — FERROUS SULFATE 325 (65 FE) MG PO TABS
325.0000 mg | ORAL_TABLET | Freq: Two times a day (BID) | ORAL | Status: DC
  Administered 2022-08-06 – 2022-08-07 (×2): 325 mg via ORAL
  Filled 2022-08-06 (×2): qty 1

## 2022-08-06 NOTE — Anesthesia Postprocedure Evaluation (Signed)
Anesthesia Post Note  Patient: Nancy Huffman  Procedure(s) Performed: PRIMARY CESAREAN SECTION  Patient location during evaluation: Mother Baby Anesthesia Type: Spinal Level of consciousness: oriented and awake and alert Pain management: pain level controlled Vital Signs Assessment: post-procedure vital signs reviewed and stable Respiratory status: spontaneous breathing and respiratory function stable Cardiovascular status: blood pressure returned to baseline and stable Postop Assessment: no headache, no backache, no apparent nausea or vomiting and able to ambulate Anesthetic complications: no   No notable events documented.   Last Vitals:  Vitals:   08/06/22 0708 08/06/22 0719  BP:  (!) 104/52  Pulse: 71 73  Resp:  14  Temp:  36.9 C  SpO2: 98% 98%    Last Pain:  Vitals:   08/06/22 0719  TempSrc: Oral  PainSc:                  [name]

## 2022-08-06 NOTE — Lactation Note (Signed)
This note was copied from a baby's chart. Lactation Consultation Note  Patient Name: Girl Blayne Garlick CLEXN'T Date: 08/06/2022 Reason for consult: Follow-up assessment;Primapara;Term Age:32 hours  Maternal Data Has patient been taught Hand Expression?: Yes Does the patient have breastfeeding experience prior to this delivery?: No  Feeding Mother's Current Feeding Choice: Breast Milk Assisted mom with latching baby to right breast in football hold after first breastfeeding on left breast x 15 min.  Mom shown how to burp baby by setting up baby on lap and using hand support for head, no burps but baby awakened before offering 2nd breast.  Mom shown how to get deep latch to decrease nipple tenderness and bruising.  LATCH Score Latch: Grasps breast easily, tongue down, lips flanged, rhythmical sucking.  Audible Swallowing: Spontaneous and intermittent  Type of Nipple: Everted at rest and after stimulation  Comfort (Breast/Nipple): Filling, red/small blisters or bruises, mild/mod discomfort  Hold (Positioning): Assistance needed to correctly position infant at breast and maintain latch.  LATCH Score: 8   Lactation Tools Discussed/Used    Interventions Interventions: Breast feeding basics reviewed;Assisted with latch;Skin to skin;Hand express;Breast compression;Support pillows;Position options;Coconut oil;Education  Discharge Pump: Personal;Manual (electric pump on order) WIC Program: No  Consult Status Consult Status: PRN    Ferol Luz 08/06/2022, 11:28 AM

## 2022-08-06 NOTE — Anesthesia Post-op Follow-up Note (Signed)
  Anesthesia Pain Follow-up Note  Patient: Nancy Huffman  Day #: 1  Date of Follow-up: 08/06/2022 Time: 12:09 PM  Last Vitals:  Vitals:   08/06/22 0708 08/06/22 0719  BP:  (!) 104/52  Pulse: 71 73  Resp:  14  Temp:  36.9 C  SpO2: 98% 98%    Level of Consciousness: alert  Pain: 3 /10   Side Effects:None  Catheter Site Exam:clean, dry     Plan: D/C from anesthesia care at surgeon's request  [name]

## 2022-08-06 NOTE — Progress Notes (Addendum)
Postpartum Day # 1: Cesarean Delivery (primary for breech presentation at term)  Subjective: Patient reports tolerating PO, + flatus, and no problems voiding.  Notes breastfeeding is going well.  Bleeding is light to moderate.  Pain is well controlled with medications.   Objective: Vital signs in last 24 hours: Temp:  [97.9 F (36.6 C)-98.6 F (37 C)] 98.5 F (36.9 C) (02/03 0719) Pulse Rate:  [67-84] 73 (02/03 0719) Resp:  [14-18] 14 (02/03 0719) BP: (103-113)/(49-64) 104/52 (02/03 0719) SpO2:  [97 %-100 %] 98 % (02/03 0719) FiO2 (%):  [98 %] 98 % (02/03 0719)  Physical Exam:  General: alert and no distress Lungs: clear to auscultation bilaterally Breasts: normal appearance, no masses or tenderness Heart: regular rate and rhythm, S1, S2 normal, no murmur, click, rub or gallop Abdomen: soft, non-tender; bowel sounds normal; no masses,  no organomegaly Pelvis: Lochia appropriate, Uterine Fundus firm, Incision: healing well, no significant drainage, no dehiscence, no significant erythema Extremities: DVT Evaluation: No evidence of DVT seen on physical exam. Negative Homan's sign.  No cords or calf tenderness. No significant calf/ankle edema.  Recent Labs    08/04/22 1218 08/06/22 0605  HGB 9.3* 7.9*  HCT 30.6* 26.2*    Assessment/Plan: Status post Cesarean section. Doing well postoperatively.  Breastfeeding, Lactation consult as needed Contraception: condoms/withdrawal Regular diet as tolerated Continue PO pain management Anemia of pregnancy, asymptomatic. Can continue PO iron supplementation BID Varicella non-immune, for vaccination postpartum Continue current care Plan for discharge tomorrow  Rubie Maid, MD [facility]

## 2022-08-06 NOTE — Discharge Summary (Signed)
Postpartum Discharge Summary  Date of Service updated 08/06/2022     Patient Name: Nancy Huffman DOB: 21-Mar-1991 MRN: [id number]  Date of admission: 08/05/2022 Delivery date:08/05/2022  Delivering provider: Rubie Maid  Date of discharge: 08/07/2022  Admitting diagnosis: Breech presentation of fetus [O32.1XX0] Intrauterine pregnancy: [redacted]w[redacted]d     Secondary diagnosis:  Principal Problem:   Breech presentation of fetus Active Problems:   Maternal varicella, non-immune   Anemia in pregnancy, third trimester   Obesity affecting pregnancy in third trimester   Failed external cephalic version  Additional problems: None    Discharge diagnosis: Term Pregnancy Delivered and Anemia                                              Post partum procedures: None Augmentation: N/A Complications: None  Hospital course: Sceduled C/S   32 y.o. yo G1P1001 at [redacted]w[redacted]d was admitted to the hospital 08/05/2022 for scheduled cesarean section with the following indication:Malpresentation (frank breech).Delivery details are as follows:  Membrane Rupture Time/Date: 8:24 AM ,08/05/2022   Delivery Method:C-Section, Low Transverse  Details of operation can be found in separate operative note.  Patient had an uncompicated postpartum course.  She is ambulating, tolerating a regular diet, passing flatus, and urinating well. Patient is discharged home in stable condition on  08/07/22        Newborn Data: Birth date:08/05/2022  Birth time:8:26 AM  Gender:Female  Living status:Living  Apgars:8 ,9  Weight:3360 g     Magnesium Sulfate received: No BMZ received: No Rhophylac:No MMR:No T-DaP:Given prenatally Flu: No Transfusion:No  Physical exam  Vitals:   08/05/22 2043 08/05/22 2308 08/06/22 0708 08/06/22 0719  BP: (!) 105/57 (!) 103/49  (!) 104/52  Pulse: 84 81 71 73  Resp: 16 18  14   Temp: 98.6 F (37 C) 98.3 F (36.8 C)  98.5 F (36.9 C)  TempSrc: Oral Oral  Oral  SpO2: 97% 97% 98% 98%  Weight:       Height:       General: alert, cooperative, and no distress Lochia: appropriate Uterine Fundus: firm Incision: Healing well with no significant drainage, No significant erythema, Dressing is clean, dry, and intact DVT Evaluation: No evidence of DVT seen on physical exam. No cords or calf tenderness. No significant calf/ankle edema.   Labs: Lab Results  Component Value Date   WBC 10.9 (H) 08/06/2022   HGB 7.9 (L) 08/06/2022   HCT 26.2 (L) 08/06/2022   MCV 74.2 (L) 08/06/2022   PLT 209 08/06/2022        No data to display         Edinburgh Score:    08/06/2022    5:54 AM  Edinburgh Postnatal Depression Scale Screening Tool  I have been able to laugh and see the funny side of things. 0  I have looked forward with enjoyment to things. 0  I have blamed myself unnecessarily when things went wrong. 0  I have been anxious or worried for no good reason. 1  I have felt scared or panicky for no good reason. 0  Things have been getting on top of me. 0  I have been so unhappy that I have had difficulty sleeping. 0  I have felt sad or miserable. 0  I have been so unhappy that I have been crying. 0  The thought of  harming myself has occurred to me. 0  Edinburgh Postnatal Depression Scale Total 1      After visit meds:  Allergies as of 08/06/2022       Reactions   Other    seasonal     Med Rec must be completed prior to using this [facility]***        Discharge home in stable condition Infant Feeding: Breast Infant Disposition:home with mother Discharge instruction: per After Visit Summary and Postpartum booklet. Activity: Advance as tolerated. Pelvic rest for 6 weeks.  Diet: routine diet Anticipated Birth Control: Condoms and withdrawal method Postpartum Appointment:6 weeks Additional Postpartum F/U: Incision check 1 week Future Appointments: Future Appointments  Date Time Provider [place]  08/11/2022 10:15 AM Rubie Maid, MD [facility] None   Follow up  Visit:      08/06/2022 Rubie Maid, MD

## 2022-08-07 MED ORDER — OXYCODONE HCL 5 MG PO TABS: 5.0000 mg | ORAL_TABLET | Freq: Four times a day (QID) | ORAL | 0 refills | Status: DC | PRN

## 2022-08-07 MED ORDER — IBUPROFEN 600 MG PO TABS: 600.0000 mg | ORAL_TABLET | Freq: Four times a day (QID) | ORAL | 0 refills | Status: AC

## 2022-08-07 NOTE — Lactation Note (Signed)
This note was copied from a baby's chart. Lactation Consultation Note  Patient Name: Girl Tashonda Pinkus [id number] Date: 08/07/2022 Reason for consult: Follow-up assessment;Primapara;Term;Breastfeeding assistance Age:32 hours  Maternal Data G1P1, Mom has a positive outlook on breastfeeding. Mom shared continued discomfort with nipples, but that coconut oil and nipple comfort gels seems to help. Reviewed proper latch and observed on site. Baby Reeves Forth seems to be cluster feeding, so reviewed the importance of proper latch to avoid further nipple trauma.   Follow up consult today, provided discharge instructions for feeding. Has patient been taught Hand Expression?: Yes Does the patient have breastfeeding experience prior to this delivery?: No  Feeding Mother's plan is to continue breastfeeding, but does have a manual pump at home.  Mother's Current Feeding Choice: Breast Milk  LATCH Score Latch: Repeated attempts needed to sustain latch, nipple held in mouth throughout feeding, stimulation needed to elicit sucking reflex.  Audible Swallowing: Spontaneous and intermittent  Type of Nipple: Everted at rest and after stimulation  Comfort (Breast/Nipple): Soft / non-tender  Hold (Positioning): Assistance needed to correctly position infant at breast and maintain latch.  LATCH Score: 8   Lactation Tools Discussed/Used  Mother will continue use of coconut oil and nipple comfort gels. Rinse prior to application and discard after 24 hours of use  Interventions Mom has personal manual pump. Interventions: Breast feeding basics reviewed;Hand express;Adjust position;Support pillows;Comfort gels;Coconut oil;Education  Discharge Discharge Education: Engorgement and breast care;Warning signs for feeding baby;Outpatient recommendation Pump: Manual  Consult Status Consult Status: Complete Date: 08/07/22 Follow-up type: Call as needed  Update provided to care nurse  Carola Rhine 08/07/2022, 11:40 AM

## 2022-08-07 NOTE — Discharge Instructions (Signed)
When the office opens on Monday please make your postpartum appointment for 6 weeks and an incision check  appointment in one week.  If you have any questions or concerns you can call the on call provider.  For urgent concerns go to the nearest emergency department for evaluation.

## 2022-08-07 NOTE — Progress Notes (Signed)
Discharge instructions reviewed with patient and father of baby, Merrilee Seashore.  Follow up appointments reviewed.  Questions answered and printed copies given to patient for reference after discharge home.

## 2022-08-10 ENCOUNTER — Other Ambulatory Visit: Payer: Medicaid Other

## 2022-08-10 ENCOUNTER — Encounter: Payer: Medicaid Other | Admitting: Obstetrics and Gynecology

## 2022-08-10 NOTE — Progress Notes (Signed)
    OBSTETRICS/GYNECOLOGY POST-OPERATIVE CLINIC VISIT  Subjective:     Nancy Huffman is a 32 y.o. female who presents to the clinic 1 weeks status post [place]  for Breech presentation of fetus . Eating a regular diet {with-without:5700} difficulty. Bowel movements are {normal/abnormal***:19619}. {pain control:13522::"The patient is not having any pain."}  {Common ambulatory SmartLinks:19316}  Review of Systems {ros; complete:30496}   Objective:   There were no vitals taken for this visit. There is no height or weight on file to calculate BMI.  General:  alert and no distress  Abdomen: soft, bowel sounds active, non-tender  Incision:   {incision:13716::"no dehiscence","incision well approximated","healing well","no drainage","no erythema","no hernia","no seroma","no swelling"}    Pathology:    Assessment:   Patient s/p PRIMARY CESAREAN SECTION  (surgery)  {doing well:13525::"Doing well postoperatively."}   Plan:   1. Continue any current medications as instructed by provider. 2. Wound care discussed. 3. Operative findings again reviewed. Pathology report discussed. 4. Activity restrictions: {restrictions:13723} 5. Anticipated return to work: {work return:14002}. 6. Follow up: {2-56:38937} {time; units:18646} for ***    Rubie Maid, MD [place]

## 2022-08-11 ENCOUNTER — Encounter: Payer: Self-pay | Admitting: Obstetrics and Gynecology

## 2022-08-11 ENCOUNTER — Ambulatory Visit (INDEPENDENT_AMBULATORY_CARE_PROVIDER_SITE_OTHER): Payer: Medicaid Other | Admitting: Obstetrics and Gynecology

## 2022-08-11 VITALS — BP 113/65 | HR 87 | Resp 16 | Ht 61.0 in | Wt 214.2 lb

## 2022-08-11 DIAGNOSIS — Z1332 Encounter for screening for maternal depression: Secondary | ICD-10-CM

## 2022-08-11 DIAGNOSIS — Z4889 Encounter for other specified surgical aftercare: Secondary | ICD-10-CM

## 2022-08-11 NOTE — Patient Instructions (Signed)

## 2022-08-17 ENCOUNTER — Encounter: Payer: Medicaid Other | Admitting: Obstetrics and Gynecology

## 2022-09-20 ENCOUNTER — Ambulatory Visit (INDEPENDENT_AMBULATORY_CARE_PROVIDER_SITE_OTHER): Payer: Medicaid Other | Admitting: Obstetrics and Gynecology

## 2022-09-20 ENCOUNTER — Encounter: Payer: Self-pay | Admitting: Obstetrics and Gynecology

## 2022-09-20 DIAGNOSIS — O9081 Anemia of the puerperium: Secondary | ICD-10-CM

## 2022-09-20 DIAGNOSIS — Z1332 Encounter for screening for maternal depression: Secondary | ICD-10-CM

## 2022-09-20 NOTE — Patient Instructions (Signed)

## 2022-09-20 NOTE — Progress Notes (Signed)
OBSTETRICS POSTPARTUM CLINIC PROGRESS NOTE  Subjective:     Nancy Huffman is a 32 y.o. G43P1001 female who presents for a postpartum visit. She is 6 week postpartum following a primary low-transverse CESAREAN SECTION for breech presentation at term. I have fully reviewed the prenatal and intrapartum course. The delivery was at 39.0 gestational weeks.  Anesthesia: spinal. Postpartum course has been going well. Baby's course has been goodh. Baby is feeding by breast. Bleeding: patient has not not resumed menses, with No LMP recorded.. Bowel function is normal. Bladder function is normal. Patient is not sexually active. Contraception method desired is condoms. Postpartum depression screening: negative.  EDPS score is 0.    The following portions of the patient's history were reviewed and updated as appropriate: allergies, current medications, past family history, past medical history, past social history, past surgical history, and problem list.  Review of Systems Pertinent items are noted in HPI.   Objective:    BP 116/74   Pulse 89   Resp 16   Ht 5' 1.5" (1.562 m)   Wt 213 lb 12.8 oz (97 kg)   Breastfeeding Yes   BMI 39.74 kg/m   General:  alert and no distress   Breasts:  inspection negative, no nipple discharge or bleeding, no masses or nodularity palpable  Lungs: clear to auscultation bilaterally  Heart:  regular rate and rhythm, S1, S2 normal, no murmur, click, rub or gallop  Abdomen: soft, non-tender; bowel sounds normal; no masses,  no organomegaly.  Well healed Pfannenstiel incision   Vulva:  normal  Vagina: normal vagina, no discharge, exudate, lesion, or erythema  Cervix:  no cervical motion tenderness and no lesions  Corpus: normal size, contour, position, consistency, mobility, non-tender  Adnexa:  normal adnexa and no mass, fullness, tenderness  Rectal Exam: Not performed.            09/20/2022    1:39 PM 08/11/2022   10:19 AM 08/06/2022    5:54 AM 01/10/2022    10:30 AM  Edinburgh Postnatal Depression Scale Screening Tool  I have been able to laugh and see the funny side of things. 0 0 0 0  I have looked forward with enjoyment to things. 0 0 0 0  I have blamed myself unnecessarily when things went wrong. 0 1 0 0  I have been anxious or worried for no good reason. 0 0 1 0  I have felt scared or panicky for no good reason. 0 0 0 0  Things have been getting on top of me. 0 0 0 0  I have been so unhappy that I have had difficulty sleeping. 0 0 0 0  I have felt sad or miserable. 0 0 0 0  I have been so unhappy that I have been crying. 0 0 0 0  The thought of harming myself has occurred to me. 0 0 0 0  Edinburgh Postnatal Depression Scale Total 0 1 1 0     Labs:  Lab Results  Component Value Date   HGB 7.9 (L) 08/06/2022     Assessment:   1. Postpartum care following cesarean delivery   2. Postpartum anemia   3. Encounter for screening for maternal depression   4. Lactating mother      Plan:   1. Contraception: condoms.  2. Will check Hgb for h/o postpartum anemia of less than 10.  3. Breastfeeding going well, continue to encourage exclusively for at least 6 months.  4. Follow up  in:  4-6  months for annual exam or as needed.    Rubie Maid, MD [place]

## 2023-04-05 ENCOUNTER — Telehealth: Payer: Medicaid Other | Admitting: Physician Assistant

## 2023-04-05 DIAGNOSIS — L237 Allergic contact dermatitis due to plants, except food: Secondary | ICD-10-CM | POA: Diagnosis not present

## 2023-04-05 MED ORDER — PREDNISONE 10 MG PO TABS: ORAL_TABLET | ORAL | 0 refills | Status: AC

## 2023-04-05 NOTE — Progress Notes (Signed)
Message sent to patient requesting further input regarding current symptoms. Awaiting patient response.  

## 2023-04-05 NOTE — Progress Notes (Signed)

## 2023-04-05 NOTE — Progress Notes (Signed)
I have spent 5 minutes in review of e-visit questionnaire, review and updating patient chart, medical decision making and response to patient.   Mia Milan Cody Jacklynn Dehaas, PA-C
